# Patient Record
Sex: Female | Born: 1955 | Race: Asian | Hispanic: No | Marital: Married | Smoking: Never smoker
Health system: Southern US, Community
[De-identification: ages and names within clinical notes are randomized; demographics above are authoritative.]

## PROBLEM LIST (undated history)

## (undated) DIAGNOSIS — E785 Hyperlipidemia, unspecified: Secondary | ICD-10-CM

## (undated) DIAGNOSIS — I10 Essential (primary) hypertension: Secondary | ICD-10-CM

## (undated) DIAGNOSIS — J309 Allergic rhinitis, unspecified: Secondary | ICD-10-CM

## (undated) DIAGNOSIS — K219 Gastro-esophageal reflux disease without esophagitis: Secondary | ICD-10-CM

## (undated) HISTORY — DX: Allergic rhinitis, unspecified: J30.9

## (undated) HISTORY — DX: Hyperlipidemia, unspecified: E78.5

## (undated) HISTORY — PX: BREAST LUMPECTOMY: SHX2

## (undated) HISTORY — DX: Essential (primary) hypertension: I10

## (undated) HISTORY — PX: TONSILLECTOMY: SUR1361

## (undated) HISTORY — DX: Gastro-esophageal reflux disease without esophagitis: K21.9

---

## 2014-01-12 ENCOUNTER — Encounter: Payer: Self-pay | Admitting: Internal Medicine

## 2014-01-12 ENCOUNTER — Telehealth: Payer: Self-pay | Admitting: *Deleted

## 2014-01-12 ENCOUNTER — Ambulatory Visit: Payer: Self-pay | Attending: Internal Medicine | Admitting: Internal Medicine

## 2014-01-12 VITALS — BP 116/74 | HR 68 | Temp 97.7°F | Resp 16 | Ht 63.0 in | Wt 102.0 lb

## 2014-01-12 DIAGNOSIS — R05 Cough: Secondary | ICD-10-CM | POA: Insufficient documentation

## 2014-01-12 DIAGNOSIS — R042 Hemoptysis: Secondary | ICD-10-CM

## 2014-01-12 DIAGNOSIS — Z7689 Persons encountering health services in other specified circumstances: Secondary | ICD-10-CM

## 2014-01-12 DIAGNOSIS — R059 Cough, unspecified: Secondary | ICD-10-CM | POA: Insufficient documentation

## 2014-01-12 DIAGNOSIS — Z7189 Other specified counseling: Secondary | ICD-10-CM

## 2014-01-12 LAB — CBC WITH DIFFERENTIAL/PLATELET
Basophils Absolute: 0 10*3/uL (ref 0.0–0.1)
Basophils Relative: 0 % (ref 0–1)
EOS PCT: 3 % (ref 0–5)
Eosinophils Absolute: 0.2 10*3/uL (ref 0.0–0.7)
HEMATOCRIT: 39.3 % (ref 36.0–46.0)
HEMOGLOBIN: 13.5 g/dL (ref 12.0–15.0)
LYMPHS PCT: 41 % (ref 12–46)
Lymphs Abs: 2.2 10*3/uL (ref 0.7–4.0)
MCH: 30.8 pg (ref 26.0–34.0)
MCHC: 34.4 g/dL (ref 30.0–36.0)
MCV: 89.5 fL (ref 78.0–100.0)
MONO ABS: 0.3 10*3/uL (ref 0.1–1.0)
MONOS PCT: 6 % (ref 3–12)
Neutro Abs: 2.7 10*3/uL (ref 1.7–7.7)
Neutrophils Relative %: 50 % (ref 43–77)
Platelets: 328 10*3/uL (ref 150–400)
RBC: 4.39 MIL/uL (ref 3.87–5.11)
RDW: 13.3 % (ref 11.5–15.5)
WBC: 5.4 10*3/uL (ref 4.0–10.5)

## 2014-01-12 LAB — COMPLETE METABOLIC PANEL WITH GFR
ALBUMIN: 4.3 g/dL (ref 3.5–5.2)
ALT: 23 U/L (ref 0–35)
AST: 22 U/L (ref 0–37)
Alkaline Phosphatase: 62 U/L (ref 39–117)
BUN: 14 mg/dL (ref 6–23)
CHLORIDE: 101 meq/L (ref 96–112)
CO2: 31 mEq/L (ref 19–32)
Calcium: 9.8 mg/dL (ref 8.4–10.5)
Creat: 0.55 mg/dL (ref 0.50–1.10)
GFR, Est Non African American: >89 mL/min
GLUCOSE: 79 mg/dL (ref 70–99)
Potassium: 3.7 mEq/L (ref 3.5–5.3)
Sodium: 138 mEq/L (ref 135–145)
Total Bilirubin: 1 mg/dL (ref 0.2–1.2)
Total Protein: 7.7 g/dL (ref 6.0–8.3)

## 2014-01-12 LAB — POCT URINALYSIS DIPSTICK
BILIRUBIN UA: NEGATIVE
Glucose, UA: NEGATIVE
KETONES UA: NEGATIVE
LEUKOCYTES UA: NEGATIVE
Nitrite, UA: NEGATIVE
PH UA: 6.5
Protein, UA: NEGATIVE
Urobilinogen, UA: 0.2

## 2014-01-12 LAB — LIPID PANEL
CHOL/HDL RATIO: 3.7 ratio
Cholesterol: 278 mg/dL — ABNORMAL HIGH (ref 0–200)
HDL: 76 mg/dL (ref >39)
LDL Cholesterol: 184 mg/dL — ABNORMAL HIGH (ref 0–99)
Triglycerides: 89 mg/dL (ref <150)
VLDL: 18 mg/dL (ref 0–40)

## 2014-01-12 LAB — POCT GLYCOSYLATED HEMOGLOBIN (HGB A1C): Hemoglobin A1C: 5.5

## 2014-01-12 NOTE — Telephone Encounter (Signed)
Called patient to schedule appointment for TB skin testing per Dr. Hyman HopesJegede since patient c/o coughing up blood. Patient scheduled for 01/13/2014 at 11:00 am.

## 2014-01-12 NOTE — Progress Notes (Signed)
Patient here today to establish care.  Patient was on Azithromycin for respiratory infection.  Patient states she still has some chest and back pain from coughing with green mucus.

## 2014-01-12 NOTE — Telephone Encounter (Signed)
Called to inform patient about her CT appointment on Friday January 15, 2014. Informed patient [facility] Patient verbalized understanding. [name],[name], RN

## 2014-01-12 NOTE — Progress Notes (Signed)
Patient ID: Judith CrockerLoan Sherman, female   DOB: Dec 17, 1955, 58 y.o.   MRN: [id number]   Judith Sherman, is a 58 y.o. female  [id number]  [id number]  DOB - Dec 17, 1955  CC:  Chief Complaint  Patient presents with  . Establish Care       HPI: Judith Sherman is a 58 y.o. female here today to establish medical care. Patient has no significant past medical history but for sometimes not she has been coughing, she has gone through 3 courses of antibiotics but continues to cough and now she is coughing her blood. She came to [place] from [place] for 17 years ago and worked in [place] in [place] until recently when she relocated to [place]. She was exposed to secondhand smoke. She has no contact with chronic cough patient. She denies chest pain or tightness or shortness of breath. She does not have leg swelling no abdominal swelling. She agrees to mild weakness of about 6 pounds over this period of time. She remembers her father died of tuberculosis back in [place] but has little or no contact after diagnosis. She has no personal history of diabetes or hypertension, she does not smoke cigarette directly, she does not drink alcohol. Patient has No headache, No chest pain, No abdominal pain - No Nausea, No new weakness tingling or numbness.  Not on File No past medical history on file. No current outpatient prescriptions on file prior to visit.   No current facility-administered medications on file prior to visit.   No family history on file. History   Social History  . Marital Status: Divorced    Spouse Name: N/A    Number of Children: N/A  . Years of Education: N/A   Occupational History  . Not on file.   Social History Main Topics  . Smoking status: Never Smoker   . Smokeless tobacco: Not on file  . Alcohol Use: Not on file  . Drug Use: Not on file  . Sexual Activity: Not on file   Other Topics Concern  . Not on file   Social History Narrative  . No narrative on file     Review of Systems: Constitutional: Negative for fever, chills, diaphoresis, activity change, appetite change and fatigue. HENT: Negative for ear pain, nosebleeds, congestion, facial swelling, rhinorrhea, neck pain, neck stiffness and ear discharge.  Eyes: Negative for pain, discharge, redness, itching and visual disturbance. Respiratory: Positive for cough, no choking, chest tightness, shortness of breath, wheezing or stridor.  Cardiovascular: Negative for chest pain, palpitations and leg swelling. Gastrointestinal: Negative for abdominal distention. Genitourinary: Negative for dysuria, urgency, frequency, hematuria, flank pain, decreased urine volume, difficulty urinating and dyspareunia.  Musculoskeletal: Negative for back pain, joint swelling, arthralgia and gait problem. Neurological: Negative for dizziness, tremors, seizures, syncope, facial asymmetry, speech difficulty, weakness, light-headedness, numbness and headaches.  Hematological: Negative for adenopathy. Does not bruise/bleed easily. Psychiatric/Behavioral: Negative for hallucinations, behavioral problems, confusion, dysphoric mood, decreased concentration and agitation.    Objective:   Filed Vitals:   01/12/14 0952  BP: 116/74  Pulse: 68  Temp: 97.7 F (36.5 C)  Resp: 16    Physical Exam: Constitutional: Patient appears well-developed and well-nourished. No distress. Thin built HENT: Normocephalic, atraumatic, External right and left ear normal. Oropharynx is clear and moist.  Eyes: Conjunctivae and EOM are normal. PERRLA, no scleral icterus. Neck: Normal ROM. Neck supple. No JVD. No tracheal deviation. No thyromegaly. CVS: RRR, S1/S2 +, no murmurs, no gallops, no carotid bruit.  Pulmonary: Effort and  breath sounds normal, no stridor, rhonchi, wheezes, rales.  Abdominal: Soft. BS +, no distension, tenderness, rebound or guarding.  Musculoskeletal: Normal range of motion. No edema and no tenderness.   Lymphadenopathy: No lymphadenopathy noted, cervical, inguinal or axillary Neuro: Alert. Normal reflexes, muscle tone coordination. No cranial nerve deficit. Skin: Skin is warm and dry. No rash noted. Not diaphoretic. No erythema. No pallor. Psychiatric: Normal mood and affect. Behavior, judgment, thought content normal.  No results found for this basename: WBC, HGB, HCT, MCV, PLT   No results found for this basename: CREATININE, BUN, NA, K, CL, CO2    Lab Results  Component Value Date   HGBA1C 5.5 01/12/2014   Lipid Panel  No results found for this basename: chol, trig, hdl, cholhdl, vldl, ldlcalc       Assessment and plan:   1. Establishing care with new doctor, encounter for  - POCT urinalysis dipstick - CBC with Differential - COMPLETE METABOLIC PANEL WITH GFR - POCT glycosylated hemoglobin (Hb A1C) - Lipid panel - Urinalysis, Complete - Vit D  25 hydroxy (rtn osteoporosis monitoring)  2. Hemoptysis  - CT Chest W Contrast; Future - PPD  Patient was counseled extensively on nutrition and exercise  Return in about 3 months (around 04/14/2014), or if symptoms worsen or fail to improve, for COPD.  The patient was given clear instructions to go to ER or return to medical center if symptoms don't improve, worsen or new problems develop. The patient verbalized understanding. The patient was told to call to get lab results if they haven't heard anything in the next week.     This note has been created with Education officer, environmentalDragon speech recognition software and smart phrase technology. Any transcriptional errors are unintentional.    Jeanann LewandowskyJEGEDE, OLUGBEMIGA, MD, MHA, FACP, FAAP [facility] [place], [place] [phone]   01/12/2014, 11:16 AM

## 2014-01-12 NOTE — Patient Instructions (Signed)
Hemoptysis  Hemoptysis, which means coughing up blood, can be a sign of a minor problem or a serious medical condition. The blood that is coughed up may come from the lungs and airways. Coughed-up blood can also come from bleeding that occurs outside the lungs and airways. Blood can drain into the windpipe during a severe nosebleed or when blood is vomited from the stomach. Because hemoptysis can be a sign of something serious, a medical evaluation is required. For some people with hemoptysis, no definite cause is ever identified.  CAUSES   The most common cause of hemoptysis is bronchitis. Some other common causes include:    A ruptured blood vessel caused by coughing or an infection.    A medical condition that causes damage to the large air passageways (bronchiectasis).    A blood clot in the lungs (pulmonary embolism).    Pneumonia.    Tuberculosis.    Breathing in a small foreign object.    Cancer.  For some people with hemoptysis, no definite cause is ever identified.   HOME CARE INSTRUCTIONS   Only take over-the-counter or prescription medicines as directed by your caregiver. Do not use cough suppressants unless your caregiver approves.   If your caregiver prescribes antibiotic medicines, take them as directed. Finish them even if you start to feel better.   Do not smoke. Also avoid secondhand smoke.   Follow up with your caregiver as directed.  SEEK IMMEDIATE MEDICAL CARE IF:    You cough up bloody mucus for longer than a week.   You have a blood-producing cough that is severe or getting worse.   You have a blood-producing cough thatcomes and goes over time.   You develop problems with your breathing.    You vomit blood.   You develop bloody or black-colored stools.   You have chest pain.    You develop night sweats.   You feel faint or pass out.    You have a fever or persistent symptoms for more than 2-3 days.   You have a fever and your symptoms suddenly get worse.  MAKE  SURE YOU:   Understand these instructions.   Will watch your condition.   Will get help right away if you are not doing well or get worse.  Document Released: 09/17/2001 Document Revised: 06/25/2012 Document Reviewed: 04/25/2012  ExitCare Patient Information 2015 [employer]. This information is not intended to replace advice given to you by your health care provider. Make sure you discuss any questions you have with your health care provider.

## 2014-01-13 ENCOUNTER — Ambulatory Visit: Payer: No Typology Code available for payment source | Attending: Internal Medicine

## 2014-01-13 ENCOUNTER — Telehealth: Payer: Self-pay | Admitting: Emergency Medicine

## 2014-01-13 DIAGNOSIS — Z0000 Encounter for general adult medical examination without abnormal findings: Secondary | ICD-10-CM

## 2014-01-13 LAB — URINALYSIS, COMPLETE
Bacteria, UA: NONE SEEN
Bilirubin Urine: NEGATIVE
Casts: NONE SEEN
Crystals: NONE SEEN
GLUCOSE, UA: NEGATIVE mg/dL
Hgb urine dipstick: NEGATIVE
Ketones, ur: NEGATIVE mg/dL
LEUKOCYTES UA: NEGATIVE
Nitrite: NEGATIVE
PROTEIN: NEGATIVE mg/dL
SPECIFIC GRAVITY, URINE: 1.007 (ref 1.005–1.030)
SQUAMOUS EPITHELIAL / LPF: NONE SEEN
Urobilinogen, UA: 0.2 mg/dL (ref 0.0–1.0)
pH: 6.5 (ref 5.0–8.0)

## 2014-01-13 LAB — VITAMIN D 25 HYDROXY (VIT D DEFICIENCY, FRACTURES): Vit D, 25-Hydroxy: 33 ng/mL (ref 30–89)

## 2014-01-13 MED ORDER — SIMVASTATIN 40 MG PO TABS
40.0000 mg | ORAL_TABLET | Freq: Every day | ORAL | Status: DC
Start: 2014-01-13 — End: 2014-05-06

## 2014-01-13 NOTE — Progress Notes (Unsigned)
Pt is here for ppd test. Successful wheal. Pt understands her instructions to return on Friday so we can review the results.

## 2014-01-13 NOTE — Patient Instructions (Signed)
You have to come back on Friday so we can review your results for the ppd test.

## 2014-01-13 NOTE — Telephone Encounter (Signed)
Left message for pt to call for lab results with new med instructions to start taking Simvastatin 40 mg daily Med [facility]

## 2014-01-13 NOTE — Telephone Encounter (Signed)
Message copied by Darlis LoanSMITH, JILL D on Wed Jan 13, 2014 11:50 AM ------      Message from: Jeanann LewandowskyJEGEDE, OLUGBEMIGA E      Created: Wed Jan 13, 2014 10:14 AM       Please inform patient that her laboratory tests results are mostly within normal limits except for her cholesterol that is very high. We need to start her on medication, we also encouraged her to start regular physical exercise at least 3 times a week, and to adhere strictly with dietary control including low-fat low-cholesterol diet            Please call in prescription simvastatin 40 mg tablet by mouth daily, 90 tablets with 3 refills ------

## 2014-01-15 ENCOUNTER — Ambulatory Visit: Payer: Self-pay | Attending: Internal Medicine

## 2014-01-15 ENCOUNTER — Ambulatory Visit (HOSPITAL_COMMUNITY)
Admission: RE | Admit: 2014-01-15 | Discharge: 2014-01-15 | Disposition: A | Discharge status: Home / Self Care | Payer: No Typology Code available for payment source | Source: Ambulatory Visit | Attending: Internal Medicine | Admitting: Internal Medicine

## 2014-01-15 DIAGNOSIS — R61 Generalized hyperhidrosis: Secondary | ICD-10-CM | POA: Insufficient documentation

## 2014-01-15 DIAGNOSIS — R079 Chest pain, unspecified: Secondary | ICD-10-CM | POA: Insufficient documentation

## 2014-01-15 DIAGNOSIS — R042 Hemoptysis: Secondary | ICD-10-CM | POA: Insufficient documentation

## 2014-01-15 LAB — TB SKIN TEST
Induration: 0 mm
TB Skin Test: NEGATIVE

## 2014-01-15 MED ORDER — IOHEXOL 300 MG/ML  SOLN
80.0000 mL | Freq: Once | INTRAMUSCULAR | Status: AC | PRN
  Administered 2014-01-15: 80 mL via INTRAVENOUS

## 2014-01-15 NOTE — Progress Notes (Unsigned)
Pt came in today to review her PPD results. Her PPD test was negative. I gave her a document to show that her test was negative.

## 2014-01-18 ENCOUNTER — Other Ambulatory Visit: Payer: Self-pay

## 2014-01-18 ENCOUNTER — Ambulatory Visit (HOSPITAL_COMMUNITY)
Admission: RE | Admit: 2014-01-18 | Discharge: 2014-01-18 | Disposition: A | Discharge status: Home / Self Care | Payer: No Typology Code available for payment source | Source: Ambulatory Visit | Attending: Cardiology | Admitting: Cardiology

## 2014-01-18 ENCOUNTER — Ambulatory Visit: Payer: Self-pay | Attending: Internal Medicine | Admitting: Internal Medicine

## 2014-01-18 VITALS — Ht 63.0 in | Wt 103.0 lb

## 2014-01-18 DIAGNOSIS — R079 Chest pain, unspecified: Secondary | ICD-10-CM | POA: Insufficient documentation

## 2014-01-18 DIAGNOSIS — Z0000 Encounter for general adult medical examination without abnormal findings: Secondary | ICD-10-CM | POA: Insufficient documentation

## 2014-01-18 DIAGNOSIS — R05 Cough: Secondary | ICD-10-CM | POA: Insufficient documentation

## 2014-01-18 DIAGNOSIS — R634 Abnormal weight loss: Secondary | ICD-10-CM | POA: Insufficient documentation

## 2014-01-18 DIAGNOSIS — R059 Cough, unspecified: Secondary | ICD-10-CM | POA: Insufficient documentation

## 2014-01-18 NOTE — Progress Notes (Signed)
Patient ID: Judith Sherman, female   DOB: April 17, 1956, 58 y.o.   MRN: [id number]   Judith Sherman, is a 58 y.o. female  [id number]  [id number]  DOB - April 17, 1956  Chief Complaint  Patient presents with  . Follow-up        Subjective:   Judith Sherman is a 58 y.o. female here today for a follow up visit. Patient was seen here recently for chronic cough-and weight loss, CT chest was ordered which came back normal, also had screening for tuberculosis came back negative. Patient is here today to review the tests results and also to request for her annual physical examination. She has some chest pain mostly on the right side not radiating, she does not have any reflux disease. Her laboratory tests recently showed dyslipidemia patient has been started on medication. She does not smoke cigarettes she does not drink alcohol Patient has No headache, No chest pain, No abdominal pain - No Nausea, No new weakness tingling or numbness, No Cough - SOB.  Problem  Chest Pain, Unspecified  Preventative Health Care    ALLERGIES: Not on File  PAST MEDICAL HISTORY: No past medical history on file.  MEDICATIONS AT HOME: Prior to Admission medications   Medication Sig Start Date End Date Taking? Authorizing Provider  simvastatin (ZOCOR) 40 MG tablet Take 1 tablet (40 mg total) by mouth at bedtime. 01/13/14   Jeanann Lewandowskylugbemiga Jegede, MD     Objective:   Filed Vitals:   01/18/14 1151  Height: 5\' 3"  (1.6 m)  Weight: 103 lb (46.72 kg)    Exam General appearance : Awake, alert, not in any distress. Speech Clear. Not toxic looking HEENT: Atraumatic and Normocephalic, pupils equally reactive to light and accomodation Neck: supple, no JVD. No cervical lymphadenopathy.  Chest:Good air entry bilaterally, no added sounds  CVS: S1 S2 regular, no murmurs.  Abdomen: Bowel sounds present, Non tender and not distended with no gaurding, rigidity or rebound. Extremities: B/L Lower Ext shows no edema, both legs are  warm to touch Neurology: Awake alert, and oriented X 3, CN II-XII intact, Non focal Skin:No Rash Wounds:N/A  Data Review Lab Results  Component Value Date   HGBA1C 5.5 01/12/2014     Assessment & Plan   1. Chest pain, unspecified  - EKG 12-Lead shows normal sinus rhythm with no specific ST-T changes  2. Preventative health care  - HM COLONOSCOPY - Ambulatory referral to Gastroenterology - MM DIGITAL SCREENING BILATERAL; Future  Patient was counseled extensively about nutrition and exercise   Return in about 6 months (around 07/20/2014), or if symptoms worsen or fail to improve, for Dyslipidemia, , Routine Follow Up.  The patient was given clear instructions to go to ER or return to medical center if symptoms don't improve, worsen or new problems develop. The patient verbalized understanding. The patient was told to call to get lab results if they haven't heard anything in the next week.   This note has been created with Education officer, environmentalDragon speech recognition software and smart phrase technology. Any transcriptional errors are unintentional.    Jeanann LewandowskyJEGEDE, OLUGBEMIGA, MD, MHA, FACP, FAAP [facility], [place] [phone]   01/18/2014, 12:38 PM

## 2014-01-18 NOTE — Patient Instructions (Signed)

## 2014-01-18 NOTE — Progress Notes (Signed)
Pt is here having chest pain and pain in her back. Pt is requesting to review her lab results and ct scan.

## 2014-01-21 ENCOUNTER — Telehealth: Payer: Self-pay | Admitting: *Deleted

## 2014-01-21 NOTE — Telephone Encounter (Signed)
Patient called stating she has not received a call about her mammogram appointment. [facility] for Wednesday 01/27/2014 at 0815. Patient notified of appointment and [facility] Patient verbalized understanding.

## 2014-01-25 ENCOUNTER — Telehealth: Payer: Self-pay

## 2014-01-25 NOTE — Telephone Encounter (Signed)
Message copied by Lestine MountJUAREZ, Kenslie Abbruzzese L on Mon Jan 25, 2014 12:28 PM ------      Message from: Jeanann LewandowskyJEGEDE, OLUGBEMIGA E      Created: Wed Jan 20, 2014 11:02 PM       Please inform patient that her CT chest with contrast is normal ------

## 2014-01-25 NOTE — Telephone Encounter (Signed)
Interpreter line used Patient not available Left message to return our call

## 2014-01-27 ENCOUNTER — Ambulatory Visit (HOSPITAL_COMMUNITY)
Admission: RE | Admit: 2014-01-27 | Discharge: 2014-01-27 | Disposition: A | Discharge status: Home / Self Care | Payer: Self-pay | Source: Ambulatory Visit | Attending: Internal Medicine | Admitting: Internal Medicine

## 2014-01-27 ENCOUNTER — Other Ambulatory Visit: Payer: Self-pay | Admitting: Internal Medicine

## 2014-01-27 DIAGNOSIS — N63 Unspecified lump in unspecified breast: Secondary | ICD-10-CM

## 2014-01-27 DIAGNOSIS — Z0000 Encounter for general adult medical examination without abnormal findings: Secondary | ICD-10-CM

## 2014-01-29 ENCOUNTER — Ambulatory Visit: Payer: No Typology Code available for payment source | Attending: Internal Medicine

## 2014-02-01 ENCOUNTER — Other Ambulatory Visit: Payer: Self-pay

## 2014-02-01 ENCOUNTER — Other Ambulatory Visit: Payer: Self-pay | Admitting: Internal Medicine

## 2014-02-01 DIAGNOSIS — N63 Unspecified lump in unspecified breast: Secondary | ICD-10-CM

## 2014-02-04 ENCOUNTER — Other Ambulatory Visit: Payer: Self-pay

## 2014-02-04 ENCOUNTER — Inpatient Hospital Stay: Admission: RE | Admit: 2014-02-04 | Payer: Self-pay | Source: Ambulatory Visit

## 2014-02-17 ENCOUNTER — Encounter (INDEPENDENT_AMBULATORY_CARE_PROVIDER_SITE_OTHER): Payer: Self-pay

## 2014-02-17 ENCOUNTER — Ambulatory Visit
Admission: RE | Admit: 2014-02-17 | Discharge: 2014-02-17 | Disposition: A | Discharge status: Home / Self Care | Payer: No Typology Code available for payment source | Source: Ambulatory Visit | Attending: Internal Medicine | Admitting: Internal Medicine

## 2014-02-17 DIAGNOSIS — N63 Unspecified lump in unspecified breast: Secondary | ICD-10-CM

## 2014-02-18 ENCOUNTER — Telehealth: Payer: Self-pay | Admitting: *Deleted

## 2014-02-18 NOTE — Telephone Encounter (Signed)
Left message to return my call.  

## 2014-02-18 NOTE — Telephone Encounter (Signed)
Message copied by Raynelle CharyWINFREE, Cire Deyarmin R on Thu Feb 18, 2014  9:43 AM ------      Message from: Jeanann LewandowskyJEGEDE, OLUGBEMIGA E      Created: Wed Feb 17, 2014  5:26 PM       Please inform patient that her mammogram shows no evidence of malignancy ------

## 2014-04-05 ENCOUNTER — Telehealth: Payer: Self-pay | Admitting: Internal Medicine

## 2014-04-05 NOTE — Telephone Encounter (Signed)
Pt. Stated that pcp was referring her to get a Colonscopy done. Pt. Does not have any information regarding this referral, please f/u with pt.

## 2014-04-13 ENCOUNTER — Ambulatory Visit: Payer: Self-pay | Admitting: Internal Medicine

## 2014-05-06 ENCOUNTER — Encounter: Payer: Self-pay | Admitting: Internal Medicine

## 2014-05-06 ENCOUNTER — Ambulatory Visit: Payer: No Typology Code available for payment source | Attending: Internal Medicine | Admitting: Internal Medicine

## 2014-05-06 VITALS — BP 144/85 | HR 71 | Temp 97.6°F | Resp 16 | Ht 63.0 in | Wt 100.0 lb

## 2014-05-06 DIAGNOSIS — Z0000 Encounter for general adult medical examination without abnormal findings: Secondary | ICD-10-CM

## 2014-05-06 DIAGNOSIS — Z681 Body mass index (BMI) 19 or less, adult: Secondary | ICD-10-CM | POA: Insufficient documentation

## 2014-05-06 DIAGNOSIS — R1031 Right lower quadrant pain: Secondary | ICD-10-CM | POA: Insufficient documentation

## 2014-05-06 DIAGNOSIS — E785 Hyperlipidemia, unspecified: Secondary | ICD-10-CM | POA: Insufficient documentation

## 2014-05-06 DIAGNOSIS — K219 Gastro-esophageal reflux disease without esophagitis: Secondary | ICD-10-CM | POA: Insufficient documentation

## 2014-05-06 DIAGNOSIS — R634 Abnormal weight loss: Secondary | ICD-10-CM | POA: Insufficient documentation

## 2014-05-06 LAB — POCT URINALYSIS DIPSTICK
BILIRUBIN UA: NEGATIVE
Glucose, UA: NEGATIVE
Ketones, UA: NEGATIVE
Leukocytes, UA: NEGATIVE
Nitrite, UA: NEGATIVE
Protein, UA: NEGATIVE
Urobilinogen, UA: 0.2
pH, UA: 6

## 2014-05-06 MED ORDER — PANTOPRAZOLE SODIUM 40 MG PO TBEC: 40.0000 mg | DELAYED_RELEASE_TABLET | Freq: Every day | ORAL | Status: DC

## 2014-05-06 MED ORDER — SIMVASTATIN 40 MG PO TABS: 40.0000 mg | ORAL_TABLET | Freq: Every day | ORAL | Status: DC

## 2014-05-06 NOTE — Patient Instructions (Signed)
?  au b?ng °(Abdominal Pain) °Có nhi?u nguyên nhân d?n ??n ?au b?ng. Thông th??ng ?au b?ng là do m?t b?nh gây ra và s? không ?? n?u không ?i?u tr?. B?nh này có th? ???c theo dõi và ?i?u tr? t?i nhà. Chuyên gia ch?m sóc s?c kh?e s? ti?n hành khám th?c th? và có th? yêu c?u làm xét nghi?m máu và ch?p X quang ?? xác ??nh m?c ?? nghiêm tr?ng c?a c?n ?au b?ng. Tuy nhiên, trong nhi?u tr??ng h?p, ph?i m?t nhi?u th?i gian h?n ?? xác ??nh rõ nguyên nhân gây ?au b?ng. Tr??c khi tìm ra nguyên nhân, chuyên gia ch?m sóc s?c kh?e có th? không bi?t li?u quý v? có c?n làm thêm xét nghi?m ho?c ti?p t?c ?i?u tr? hay không. °H??NG D?N CH?M SÓC T?I NHÀ  °Theo dõi c?n ?au b?ng xem có b?t k? thay ??i nào không. Nh?ng hành ??ng sau có th? giúp lo?i b? b?t c? c?m giác khó ch?u nào quý v? ?ang b?. °· Ch? s? d?ng thu?c không c?n kê ??n ho?c thu?c c?n kê ??n theo ch? d?n c?a chuyên gia ch?m sóc s?c kh?e. °· Không dùng thu?c nhu?n tràng tr? khi ???c chuyên gia ch?m sóc s?c kh?e ch? ??nh. °· Th? dùng m?t ch? ?? ?n l?ng (n??c lu?c th?t, trà, ho?c n??c) theo ch? d?n c?a chuyên gia ch?m sóc s?c kh?e. Chuy?n d?n sang m?t ch? ?? ?n nh? n?u ch?u ???c. °?I KHÁM N?U: °· Quý v? b? ?au b?ng không rõ nguyên nhân. °· Quý v? b? ?au b?ng kèm theo bu?n nôn ho?c tiêu ch?y. °· Quý v? b? ?au khi ?i ti?u ho?c ?i ngoài. °· Quý v? b? c?n ?au b?ng làm th?c gi?c vào ban ?êm. °· Quý v? b? ?au b?ng n?ng thêm ho?c ?? h?n khi ?n. °· Quý v? b? ?au b?ng n?ng thêm khi ?n ?? ?n nhi?u ch?t béo. °· Quý v? b? s?t. °NGAY L?P T?C ?I KHÁM N?U:  °· C?n ?au không kh?i trong vòng 2 gi?. °· Quý v? v?n ti?p t?c b? ói (nôn m?a). °· Ch? có th? c?m th?y ?au ? m?t s? ph?n b?ng, ch?ng h?n nh? ? ph?n b?ng bên ph?i ho?c bên d??i trái. °· Phân c?a quý v? có máu ho?c có màu ?en nh? h?c ín. °??M B?O QUÝ V?: °· Hi?u rõ các h??ng d?n này. °· S? theo dõi tình tr?ng c?a mình. °· S? yêu c?u tr? giúp ngay l?p t?c n?u quý v? c?m th?y không kh?e ho?c th?y tr?m tr?ng h?n. °Document Released: 07/09/2005  Document Revised: 07/14/2013 °ExitCare® Patient Information ©2015 [employer], [employer]. This information is not intended to replace advice given to you by your health care provider. Make sure you discuss any questions you have with your health care provider. ° °

## 2014-05-06 NOTE — Progress Notes (Signed)
Patient ID: Judith Sherman, female   DOB: May 26, 1956, 58 y.o.   MRN: [id number]   Judith Sherman, is a 58 y.o. female  [id number]  [id number]  DOB - May 26, 1956  Chief Complaint  Patient presents with  . Follow-up        Subjective:   Judith Sherman is a 58 y.o. female here today for a follow up visit. Patient with history of hyperlipidemia and GERD here today with c/o abdomen pain on her right side. She states that she has had that pain for a year and now the pain is back and is very painful , non-radiating, no known aggravating or relieving factor. She also said that she is having GERD and its acting up. Patient has No headache, No chest pain, No Nausea, No new weakness tingling or numbness, No Cough - SOB.   Problem  Loss of Weight  Right Lower Quadrant Abdominal Pain  Gastroesophageal Reflux Disease Without Esophagitis  Dyslipidemia    ALLERGIES: No Known Allergies  PAST MEDICAL HISTORY: History reviewed. No pertinent past medical history.  MEDICATIONS AT HOME: Prior to Admission medications   Medication Sig Start Date End Date Taking? Authorizing Provider  simvastatin (ZOCOR) 40 MG tablet Take 1 tablet (40 mg total) by mouth at bedtime. 05/06/14  Yes Quentin Angstlugbemiga E Amori Colomb, MD  pantoprazole (PROTONIX) 40 MG tablet Take 1 tablet (40 mg total) by mouth daily. 05/06/14   Quentin Angstlugbemiga E Pranika Finks, MD     Objective:   Filed Vitals:   05/06/14 1210  BP: 144/85  Pulse: 71  Temp: 97.6 F (36.4 C)  TempSrc: Oral  Resp: 16  Height: 5\' 3"  (1.6 m)  Weight: 100 lb (45.36 kg)  SpO2: 99%    Exam General appearance : Awake, alert, not in any distress. Speech Clear. Not toxic looking HEENT: Atraumatic and Normocephalic, pupils equally reactive to light and accomodation Neck: supple, no JVD. No cervical lymphadenopathy.  Chest:Good air entry bilaterally, no added sounds  CVS: S1 S2 regular, no murmurs.  Abdomen: Bowel sounds present, ++ tender but not distended with no gaurding,  rigidity or rebound. Extremities: B/L Lower Ext shows no edema, both legs are warm to touch Neurology: Awake alert, and oriented X 3, CN II-XII intact, Non focal  Data Review Lab Results  Component Value Date   HGBA1C 5.5 01/12/2014     Assessment & Plan   1. Loss of weight  - CT Abdomen Pelvis W Contrast; Future  2. Right lower quadrant abdominal pain  - CT Abdomen Pelvis W Contrast; Future - Ambulatory referral to Gastroenterology - HM COLONOSCOPY  3. Gastroesophageal reflux disease without esophagitis  - pantoprazole (PROTONIX) 40 MG tablet; Take 1 tablet (40 mg total) by mouth daily.  Dispense: 30 tablet; Refill: 3  4. Dyslipidemia  - simvastatin (ZOCOR) 40 MG tablet; Take 1 tablet (40 mg total) by mouth at bedtime.  Dispense: 90 tablet; Refill: 3  To address this please limit saturated fat to no more than 7% of your calories, limit cholesterol to 200 mg/day, increase fiber and exercise as tolerated. If needed we may add another cholesterol lowering medication to your regimen.   Return in about 6 months (around 11/05/2014), or if symptoms worsen or fail to improve, for Follow up Pain and comorbidities, Abdominal Pain.  The patient was given clear instructions to go to ER or return to medical center if symptoms don't improve, worsen or new problems develop. The patient verbalized understanding. The patient was told to call to get lab  results if they haven't heard anything in the next week.   This note has been created with Education officer, environmentalDragon speech recognition software and smart phrase technology. Any transcriptional errors are unintentional.    Jeanann LewandowskyJEGEDE, Denishia Citro, MD, MHA, FACP, FAAP [facility] [facility] [place], [place] [phone]   05/06/2014, 12:39 PM

## 2014-05-06 NOTE — Progress Notes (Signed)
Pt is here c/o abdomen pain on her right side. She states that she has had that pain for a year and now the pain is back and is very painful. She also said that she is having GERD. Pt has an interpreter.

## 2014-05-07 ENCOUNTER — Encounter: Payer: Self-pay | Admitting: Gastroenterology

## 2014-05-10 ENCOUNTER — Ambulatory Visit (HOSPITAL_COMMUNITY)
Admission: RE | Admit: 2014-05-10 | Discharge: 2014-05-10 | Disposition: A | Discharge status: Home / Self Care | Payer: Self-pay | Source: Ambulatory Visit | Attending: Internal Medicine | Admitting: Internal Medicine

## 2014-05-10 ENCOUNTER — Telehealth: Payer: Self-pay | Admitting: Emergency Medicine

## 2014-05-10 DIAGNOSIS — G8929 Other chronic pain: Secondary | ICD-10-CM | POA: Insufficient documentation

## 2014-05-10 DIAGNOSIS — R634 Abnormal weight loss: Secondary | ICD-10-CM | POA: Insufficient documentation

## 2014-05-10 DIAGNOSIS — R1031 Right lower quadrant pain: Secondary | ICD-10-CM | POA: Insufficient documentation

## 2014-05-10 MED ORDER — IOHEXOL 300 MG/ML  SOLN
100.0000 mL | Freq: Once | INTRAMUSCULAR | Status: AC | PRN
  Administered 2014-05-10: 100 mL via INTRAVENOUS

## 2014-05-10 NOTE — Telephone Encounter (Signed)
Message copied by Darlis LoanSMITH, Lilee Aldea D on Mon May 10, 2014  3:30 PM ------      Message from: Quentin AngstJEGEDE, OLUGBEMIGA E      Created: Mon May 10, 2014 10:33 AM       Please inform patient that her CT abdomen and pelvis is normal ------

## 2014-05-31 ENCOUNTER — Encounter: Payer: Self-pay | Admitting: *Deleted

## 2014-05-31 NOTE — Progress Notes (Signed)
Pt came into the office today requesting to review her CT scan. I made the pt aware of her CT results.

## 2014-06-09 ENCOUNTER — Ambulatory Visit: Payer: No Typology Code available for payment source | Admitting: Gastroenterology

## 2014-06-09 ENCOUNTER — Other Ambulatory Visit: Payer: Self-pay

## 2014-06-09 ENCOUNTER — Encounter: Payer: Self-pay | Admitting: Gastroenterology

## 2014-06-09 ENCOUNTER — Ambulatory Visit (INDEPENDENT_AMBULATORY_CARE_PROVIDER_SITE_OTHER): Payer: No Typology Code available for payment source | Admitting: Gastroenterology

## 2014-06-09 VITALS — BP 115/64 | HR 72 | Temp 96.4°F | Ht 61.0 in | Wt 101.0 lb

## 2014-06-09 DIAGNOSIS — R1031 Right lower quadrant pain: Secondary | ICD-10-CM

## 2014-06-09 DIAGNOSIS — Z1211 Encounter for screening for malignant neoplasm of colon: Secondary | ICD-10-CM

## 2014-06-09 MED ORDER — PEG-KCL-NACL-NASULF-NA ASC-C 100 G PO SOLR: 1.0000 | Freq: Once | ORAL | Status: AC

## 2014-06-09 NOTE — Patient Instructions (Signed)
1. Colonoscopy as scheduled. See separate instructions.  

## 2014-06-09 NOTE — Progress Notes (Signed)
REVIEWED. AGREE. NL CT OCT 2015.

## 2014-06-09 NOTE — Assessment & Plan Note (Signed)
58 year old [place] female, presenting with interpreter today, with 18 month history of intermittent right lower quadrant pain. No significant aggravating or alleviating factors. Recent CT scan reassuring. Overdue for colonoscopy at this time. Patient is requesting to pursue colonoscopy which is very reasonable. She subjectively feels like protonix helped her right lower quadrant pain, I find it difficult to make a correlation here. She denies any type of typical upper GI symptoms. Plan on a colonoscopy in the near future.  I have discussed the risks, alternatives, benefits with regards to but not limited to the risk of reaction to medication, bleeding, infection, perforation and the patient is agreeable to proceed. Written consent to be obtained.

## 2014-06-09 NOTE — Progress Notes (Signed)
Primary Care Physician:  Angelica Chessman, MD  Primary Gastroenterologist:  Barney Drain, MD   Chief Complaint  Patient presents with  . Abdominal Pain    HPI:  Judith Sherman is a 58 y.o. [place] female here at the request of Dr. Angelica Chessman for further evaluation of RLQ pain. Presents with interpreter.  Complains of this pain off and on for more than 18 months. First time she had the pain lasted for about 4 months. Currently she's been dealing with this pain for over a month. Some days pain free. Describes as a dull ache. Worse with a full meal. Denies nausea or vomiting. Her appetite is good. Denies weight loss. Asked her multiple times to confirm as PCP note dictated history weight loss. She denies any constipation or diarrhea. No melena rectal bleeding. Denies any heartburn.  Started on Protonix about 1 month ago. Today for couple weeks but has been feeling better since stopped the medication. Reports having an EGD done about 2 years ago in [place] and was told was normal. Remote colonoscopy over 10 years ago without any polyps per patient.  CT scan on 05/10/2014 as outlined below. No explanation for her pain. No recent labs but labs done in June were unremarkable including CBC, LFTs, kidney function.    Current Outpatient Prescriptions  Medication Sig Dispense Refill  . Ranitidine HCl (ZANTAC PO) Take by mouth as needed.    . simvastatin (ZOCOR) 40 MG tablet Take 1 tablet (40 mg total) by mouth at bedtime. 90 tablet 3  . pantoprazole (PROTONIX) 40 MG tablet Take 1 tablet (40 mg total) by mouth daily. (Patient taking differently: Take 40 mg by mouth daily. Patient quit the medication 2 weeks ago.) 30 tablet 3  . peg 3350 powder (MOVIPREP) 100 G SOLR Take 1 kit (200 g total) by mouth once. 1 kit 0   No current facility-administered medications for this visit.    Allergies as of 06/09/2014  . (No Known Allergies)    Past Medical History  Diagnosis Date  .  Hyperlipidemia     Past Surgical History  Procedure Laterality Date  . Breast lumpectomy      benign    Family History  Problem Relation Age of Onset  . Colon cancer Neg Hx     History   Social History  . Marital Status: Divorced    Spouse Name: N/A    Number of Children: 0  . Years of Education: N/A   Occupational History  . unemployed    Social History Main Topics  . Smoking status: Never Smoker   . Smokeless tobacco: Not on file  . Alcohol Use: No  . Drug Use: No  . Sexual Activity: Not on file   Other Topics Concern  . Not on file   Social History Narrative      ROS:  General: Negative for anorexia, weight loss, fever, chills, fatigue, weakness. Eyes: Negative for vision changes.  ENT: Negative for hoarseness, difficulty swallowing , nasal congestion. CV: Negative for chest pain, angina, palpitations, dyspnea on exertion, peripheral edema.  Respiratory: Negative for dyspnea at rest, dyspnea on exertion, cough, sputum, wheezing.  GI: See history of present illness. GU:  Negative for dysuria, hematuria, urinary incontinence, urinary frequency, nocturnal urination.  MS: Negative for joint pain, low back pain.  Derm: Negative for rash or itching.  Neuro: Negative for weakness, abnormal sensation, seizure, frequent headaches, memory loss, confusion.  Psych: Negative for anxiety, depression, suicidal ideation, hallucinations.  Endo: Negative  for unusual weight change.  Heme: Negative for bruising or bleeding. Allergy: Negative for rash or hives.    Physical Examination:  BP 115/64 mmHg  Pulse 72  Temp(Src) 96.4 F (35.8 C) (Oral)  Ht _0  (1.549 m)  Wt 101 lb (45.813 kg)  BMI 19.09 kg/m2   General: Well-nourished, well-developed in no acute distress.  Head: Normocephalic, atraumatic.   Eyes: Conjunctiva pink, no icterus. Mouth: Oropharyngeal mucosa moist and pink , no lesions erythema or exudate. Neck: Supple without thyromegaly, masses, or  lymphadenopathy.  Lungs: Clear to auscultation bilaterally.  Heart: Regular rate and rhythm, no murmurs rubs or gallops.  Abdomen: Bowel sounds are normal, nontender, nondistended, no hepatosplenomegaly or masses, no abdominal bruits or    hernia , no rebound or guarding.   Rectal: Not performed Extremities: No lower extremity edema. No clubbing or deformities.  Neuro: Alert and oriented x 4 , grossly normal neurologically.  Skin: Warm and dry, no rash or jaundice.   Psych: Alert and cooperative, normal mood and affect.  Labs: Lab Results  Component Value Date   WBC 5.4 01/12/2014   HGB 13.5 01/12/2014   HCT 39.3 01/12/2014   MCV 89.5 01/12/2014   PLT 328 01/12/2014   Lab Results  Component Value Date   CREATININE 0.55 01/12/2014   BUN 14 01/12/2014   NA 138 01/12/2014   K 3.7 01/12/2014   CL 101 01/12/2014   CO2 31 01/12/2014   Lab Results  Component Value Date   ALT 23 01/12/2014   AST 22 01/12/2014   ALKPHOS 62 01/12/2014   BILITOT 1.0 01/12/2014     Imaging Studies: CLINICAL DATA: Weight loss, right lower quadrant pain, chronic abdominal pain and swelling  EXAM: CT ABDOMEN AND PELVIS WITH CONTRAST  TECHNIQUE: Multidetector CT imaging of the abdomen and pelvis was performed using the standard protocol following bolus administration of intravenous contrast.  CONTRAST: 164m OMNIPAQUE IOHEXOL 300 MG/ML SOLN  COMPARISON: None  FINDINGS: Sagittal images of the spine shows mild degenerative changes lower lumbar spine. Lung bases are unremarkable. Enhanced liver is unremarkable. The pancreas, spleen and adrenal glands are unremarkable. Kidneys are symmetrical in size and enhancement. There is a cyst mid to upper pole of the right kidney posterior aspect measures 1.2 cm.  Delayed renal images shows bilateral renal symmetrical excretion. Mild atherosclerotic calcifications of abdominal aorta and iliac arteries. No aortic aneurysm.  No  hydronephrosis or hydroureter. Bilateral visualized proximal ureter on delayed images is unremarkable.  No small bowel obstruction. No pericecal inflammation. Normal appendix. No thickened or dilated bowel loops are noted.  The uterus is unremarkable. No adnexal mass. No pelvic ascites or adenopathy. The urinary bladder is unremarkable.  No destructive bony lesions are noted within pelvis.  IMPRESSION: 1. No acute inflammatory process within abdomen or pelvis. 2. There is a cyst mid to upper pole of the right kidney measures 1.2 cm. No hydronephrosis or hydroureter. 3. No pericecal inflammation. Normal appendix. 4. No adnexal mass.   Electronically Signed  By: LLahoma CrockerM.D.  On: 05/10/2014 10:28

## 2014-06-10 NOTE — Progress Notes (Signed)
cc'ed to pcp °

## 2014-06-21 ENCOUNTER — Telehealth: Payer: Self-pay

## 2014-06-21 MED ORDER — PEG-KCL-NACL-NASULF-NA ASC-C 100 G PO SOLR: 1.0000 | ORAL | Status: DC

## 2014-06-21 NOTE — Telephone Encounter (Signed)
T/C from pharmacist, Milderd Meagerriana, [facility]. Said the pt said her Movie prep was gong to be $100.00 plus. Asked for a free coupon and I had one. I gave her the numbers on it and sent the prescription to them. ( [facility]  BIN: [id number] PCN: [id number] Group: [id number] ID: [id number]  [facility] Movie prep order there.

## 2014-07-12 ENCOUNTER — Ambulatory Visit (HOSPITAL_COMMUNITY)
Admission: RE | Admit: 2014-07-12 | Discharge: 2014-07-12 | Disposition: A | Discharge status: Home / Self Care | Payer: No Typology Code available for payment source | Source: Ambulatory Visit | Attending: Gastroenterology | Admitting: Gastroenterology

## 2014-07-12 ENCOUNTER — Encounter (HOSPITAL_COMMUNITY)
Admission: RE | Disposition: A | Discharge status: Home / Self Care | Payer: Self-pay | Source: Ambulatory Visit | Attending: Gastroenterology

## 2014-07-12 ENCOUNTER — Ambulatory Visit: Payer: Self-pay | Admitting: Internal Medicine

## 2014-07-12 ENCOUNTER — Encounter (HOSPITAL_COMMUNITY): Payer: Self-pay

## 2014-07-12 DIAGNOSIS — K648 Other hemorrhoids: Secondary | ICD-10-CM | POA: Insufficient documentation

## 2014-07-12 DIAGNOSIS — K633 Ulcer of intestine: Secondary | ICD-10-CM

## 2014-07-12 DIAGNOSIS — E785 Hyperlipidemia, unspecified: Secondary | ICD-10-CM | POA: Insufficient documentation

## 2014-07-12 DIAGNOSIS — R1031 Right lower quadrant pain: Secondary | ICD-10-CM

## 2014-07-12 DIAGNOSIS — Z1211 Encounter for screening for malignant neoplasm of colon: Secondary | ICD-10-CM

## 2014-07-12 HISTORY — PX: COLONOSCOPY: SHX5424

## 2014-07-12 SURGERY — COLONOSCOPY
Anesthesia: Moderate Sedation

## 2014-07-12 MED ORDER — MEPERIDINE HCL 100 MG/ML IJ SOLN
INTRAMUSCULAR | Status: AC
  Filled 2014-07-12: qty 2

## 2014-07-12 MED ORDER — SODIUM CHLORIDE 0.9 % IV SOLN
INTRAVENOUS | Status: DC
  Administered 2014-07-12: 09:00:00 via INTRAVENOUS

## 2014-07-12 MED ORDER — MIDAZOLAM HCL 5 MG/5ML IJ SOLN
INTRAMUSCULAR | Status: DC | PRN
  Administered 2014-07-12: 1 mg via INTRAVENOUS
  Administered 2014-07-12: 2 mg via INTRAVENOUS
  Administered 2014-07-12: 1 mg via INTRAVENOUS

## 2014-07-12 MED ORDER — MIDAZOLAM HCL 5 MG/5ML IJ SOLN
INTRAMUSCULAR | Status: AC
  Filled 2014-07-12: qty 10

## 2014-07-12 MED ORDER — MEPERIDINE HCL 100 MG/ML IJ SOLN
INTRAMUSCULAR | Status: DC | PRN
  Administered 2014-07-12 (×2): 25 mg via INTRAVENOUS

## 2014-07-12 MED ORDER — ONDANSETRON HCL 4 MG/2ML IJ SOLN
INTRAMUSCULAR | Status: AC
  Filled 2014-07-12: qty 2

## 2014-07-12 MED ORDER — ONDANSETRON HCL 4 MG/2ML IJ SOLN
INTRAMUSCULAR | Status: DC | PRN
  Administered 2014-07-12: 4 mg via INTRAVENOUS

## 2014-07-12 NOTE — Discharge Instructions (Signed)
YOU HAD ONE SMALL ULCER IN YOUR SMALL BOWEL. I BIOPSIED YOUR SMALL BOWEL. YOU HAVE SMALL  INTERNAL HEMORRHOIDS.  FOLLOW A HIGH FIBER DIET. My office will contact you with your results WITHIN 14 DAYS OR YOU CAN LOOK THEM UP ON MY CHART AFTER DEC 23. NEXT COLONOSCOPY IN 10 YEARS.    Soi ru?t k?t, Ch?m [place] sau khi lm th? thu?t (Colonoscopy, Care After) Tham kh?o t? thng tin ny trong vi tu?n t?i. Nh?ng h??ng d?n ny cung c?p cho qu v? thng tin v? cch ch?m [place] b?n thn sau khi lm th? thu?t. Chuyn gia ch?m [place] s?c kh?e c?ng c th? h??ng d?n c? th? h?n cho qu v?. Vi?c ?i?u tr? c?a qu v? ? ???c ln k? ho?ch theo th?c hnh y khoa hi?n t?i, nh?ng v?n ?? ?i khi v?n x?y ra. Hy g?i cho chuyn gia ch?m [place] s?c kh?e n?u qu v? c b?t k? v?n ?? ho?c th?c m?c no sau khi lm th? thu?t. K? V?NG ?I?U G SAU TH? THU?T  Sau khi lm th? thu?t th??ng c nh?ng v?n ?? sau:  M?t l??ng mu nh? trong phn.  ?nh h?i m?t cht v b? ?au th?t ho?c ch??ng b?ng nh?. H??NG D?N CH?M [place] T?I NH  Khng li xe, v?n hnh my mc, ho?c k cc gi?y t? quan tr?ng trong 24 gi?[place].  Qu v? c th? t?m vi hoa sen ho?c tr? l?i cc ho?t ??ng th? ch?t thng th??ng, nh?ng c? ??ng v?i m?t t?c ?? ch?m h?n trong 24 gi? ??u tin.  Th??ng xuyn ngh? ng?i trong 24 gi? ??u tin.  ?i d?o xung quanh v ??t m?t ti ?m ln b?ng ?? gip gi?m ?au co th?t ho?c ch??ng b?ng.  U?ng ?? n??c ?? gi? cho n??c ti?u trong ho?c vng nh?t.  Qu v? c th? tr? l?i v?i ch? ?? ?n u?ng bnh th??ng theo ch? d?n c?a chuyn gia ch?m [place] s?c kh?e. Trnh cc mn ?n n?ng ho?c chin rn kh tiu ha.  Trnh u?ng r??u trong 24 gi? ho?c theo h??ng d?n c?a chuyn gia ch?m [place] s?c kh?e.  Ch? s? d?ng thu?c khng c?n k ??n ho?c thu?c c?n k ??n theo ch? d?n c?a chuyn gia ch?m [place] s?c kh?e.  N?u ph?i l?y m?t m?u m (sinh thi?t) trong lc lm th? thu?t:  Khng dng aspirin ho?c thu?c lm long mu trong vng 7 ngy, ho?c theo ch? d?n c?a chuyn gia ch?m [place]  s?c kh?e.  Khng u?ng r??u trong 7 ngy, ho?c theo h??ng d?n c?a chuyn gia ch?m [place] s?c kh?e.  ?n th?c ?n m?m trong 24 gi? ??u. ?I KHM N?U: Qu v? b? r?m mu lin t?c trong phn 2 - 3 ngy sau khi lm th? thu?t. NGAY L?P T?C ?I KHM N?U:  Qu v? nhi?u l?n c r?m mu trong phn.  ?i ??i ti?n c c?c mu ?ng trong phn.  B?ng qu v? ph?ng ln (ch??ng c?ng).  Qu v? b? bu?n nn ho?c nn m?a.  Qu v? b? s?t.  Qu v? b? ?au b?ng t?ng ln, khng thuyn gi?m sau khi dng thu?c. Document Released: 04/29/2013 [facility] Patient Information 2015 [employer] [place]. This information is not intended to replace advice given to you by your health care provider. Make sure you discuss any questions you have with your health care provider.

## 2014-07-12 NOTE — H&P (Signed)
  Primary Care Physician:  Angelica Chessman, MD Primary Gastroenterologist:  Dr. Oneida Alar  Pre-Procedure History & Physical: HPI:  Judith Sherman is a 57 y.o. female here for [place].  Past Medical History  Diagnosis Date  . Hyperlipidemia     Past Surgical History  Procedure Laterality Date  . Breast lumpectomy      benign    Prior to Admission medications   Medication Sig Start Date End Date Taking? Authorizing Provider  Ranitidine HCl (ZANTAC PO) Take 1 tablet by mouth daily as needed (heartburn).    Yes Historical Provider, MD  simvastatin (ZOCOR) 40 MG tablet Take 1 tablet (40 mg total) by mouth at bedtime. 05/06/14  Yes Tresa Garter, MD  pantoprazole (PROTONIX) 40 MG tablet Take 1 tablet (40 mg total) by mouth daily. Patient not taking: Reported on 06/14/2014 05/06/14   Tresa Garter, MD  peg 3350 powder (MOVIPREP) 100 G SOLR Take 1 kit (200 g total) by mouth as directed. 06/21/14   Danie Binder, MD    Allergies as of 06/09/2014  . (No Known Allergies)    Family History  Problem Relation Age of Onset  . Colon cancer Neg Hx     History   Social History  . Marital Status: Divorced    Spouse Name: N/A    Number of Children: 0  . Years of Education: N/A   Occupational History  . unemployed    Social History Main Topics  . Smoking status: Never Smoker   . Smokeless tobacco: Not on file  . Alcohol Use: No  . Drug Use: No  . Sexual Activity: Not on file   Other Topics Concern  . Not on file   Social History Narrative    Review of Systems: See HPI, otherwise negative ROS   Physical Exam: BP 133/87 mmHg  Pulse 70  Temp(Src) 97.8 F (36.6 C) (Oral)  Resp 20  Ht 5' 1" (1.549 m)  Wt 101 lb (45.813 kg)  BMI 19.09 kg/m2  SpO2 99% General:   Alert,  pleasant and cooperative in NAD Head:  Normocephalic and atraumatic. Neck:  Supple; Lungs:  Clear throughout to auscultation.    Heart:  Regular rate and rhythm. Abdomen:   Soft, nontender and nondistended. Normal bowel sounds, without guarding, and without rebound.   Neurologic:  Alert and  oriented x4;  grossly normal neurologically.  Impression/Plan:    SCREENING  Plan:  1. TCS TODAY

## 2014-07-13 NOTE — Op Note (Signed)
[facility] [address] [place], [id number]   COLONOSCOPY PROCEDURE REPORT  PATIENT: Judith Sherman, Judith Sherman  MR#: [id number] BIRTHDATE: May 05, 1956 , 58  yrs. old GENDER: female ENDOSCOPIST: Jonette EvaSandi Aubria Vanecek, MD REFERRED XB:[id number]:Olugbemiga Hyman HopesJegede, M.D. PROCEDURE DATE:  07/12/2014 PROCEDURE:   Colonoscopy with biopsy INDICATIONS:average risk for colon cancer. MEDICATIONS: Demerol 50 mg IV and Versed 4 mg IV  DESCRIPTION OF PROCEDURE:    Physical exam was performed.  Informed consent was obtained from the patient after explaining the benefits, risks, and alternatives to procedure.  The patient was connected to monitor and placed in left lateral position. Continuous oxygen was provided by nasal cannula and IV medicine administered through an indwelling cannula.  After administration of sedation and rectal exam, the patients rectum was intubated and the EC-[id number] ([id number])  colonoscope was advanced under direct visualization to the ileum.  The scope was removed slowly by carefully examining the color, texture, anatomy, and integrity mucosa on the way out.  The patient was recovered in endoscopy and discharged home in satisfactory condition.      COLON FINDINGS: A small non-bleeding and shallow ulcer was found in the terminal ileum.  Biopsies were taken around the ulcer.  , The colonic mucosa appeared normal.  , and Small internal hemorrhoids were found.  PREP QUALITY: excellent. CECAL W/D TIME: 123  minutes  COMPLICATIONS: None  ENDOSCOPIC IMPRESSION: 1.   Small ulcer in the terminal ileum 2.   Small internal hemorrhoids  RECOMMENDATIONS: FOLLOW A HIGH FIBER DIET. AWAIT BIOPSY NEXT COLONOSCOPY IN 10 YEARS.      _______________________________ eSignedJonette Eva:  Kristofer Schaffert, MD 07/13/2014 6:45 PM   CPT CODES: ICD CODES:  The ICD and CPT codes recommended by this software are interpretations from the data that the clinical staff has captured with the software.  The  verification of the translation of this report to the ICD and CPT codes and modifiers is the sole responsibility of the health care institution and practicing physician where this report was generated.  [employer] will not be held responsible for the validity of the ICD and CPT codes included on this report.  AMA assumes no liability for data contained or not contained herein. CPT is a Publishing rights managerregistered trademark of the [employer].

## 2014-07-19 ENCOUNTER — Encounter (HOSPITAL_COMMUNITY): Payer: Self-pay | Admitting: Gastroenterology

## 2014-08-16 ENCOUNTER — Encounter: Payer: Self-pay | Admitting: Internal Medicine

## 2014-08-16 ENCOUNTER — Ambulatory Visit: Payer: No Typology Code available for payment source | Attending: Internal Medicine | Admitting: Internal Medicine

## 2014-08-16 VITALS — BP 137/85 | HR 73 | Temp 98.0°F | Resp 16

## 2014-08-16 DIAGNOSIS — Z79899 Other long term (current) drug therapy: Secondary | ICD-10-CM | POA: Insufficient documentation

## 2014-08-16 DIAGNOSIS — K219 Gastro-esophageal reflux disease without esophagitis: Secondary | ICD-10-CM | POA: Insufficient documentation

## 2014-08-16 DIAGNOSIS — E785 Hyperlipidemia, unspecified: Secondary | ICD-10-CM | POA: Insufficient documentation

## 2014-08-16 LAB — COMPLETE METABOLIC PANEL WITH GFR
ALK PHOS: 76 U/L (ref 39–117)
ALT: 16 U/L (ref 0–35)
AST: 21 U/L (ref 0–37)
Albumin: 4.5 g/dL (ref 3.5–5.2)
BILIRUBIN TOTAL: 0.5 mg/dL (ref 0.2–1.2)
BUN: 14 mg/dL (ref 6–23)
CHLORIDE: 103 meq/L (ref 96–112)
CO2: 28 mEq/L (ref 19–32)
Calcium: 10.4 mg/dL (ref 8.4–10.5)
Creat: 0.62 mg/dL (ref 0.50–1.10)
GLUCOSE: 103 mg/dL — AB (ref 70–99)
POTASSIUM: 5.8 meq/L — AB (ref 3.5–5.3)
Sodium: 142 mEq/L (ref 135–145)
Total Protein: 8.1 g/dL (ref 6.0–8.3)

## 2014-08-16 LAB — LIPID PANEL
Cholesterol: 276 mg/dL — ABNORMAL HIGH (ref 0–200)
HDL: 63 mg/dL (ref >39)
LDL Cholesterol: 164 mg/dL — ABNORMAL HIGH (ref 0–99)
TRIGLYCERIDES: 244 mg/dL — AB (ref <150)
Total CHOL/HDL Ratio: 4.4 Ratio
VLDL: 49 mg/dL — AB (ref 0–40)

## 2014-08-16 MED ORDER — SIMVASTATIN 40 MG PO TABS: 40.0000 mg | ORAL_TABLET | Freq: Every day | ORAL | Status: DC

## 2014-08-16 NOTE — Progress Notes (Signed)
Patient ID: Judith Sherman Rando, female   DOB: May 08, 1956, 59 y.o.   MRN: [id number]   Judith Sherman Corey, is a 59 y.o. female  [id number]  [id number]  DOB - May 08, 1956  No chief complaint on file.       Subjective:   Judith Sherman Berggren is a 59 y.o. female here today for a follow up visit. Patient has history of hyperlipidemia and GERD. Her medications include simvastatin 40 mg tablet by mouth daily, pantoprazole 40 mg tablet by mouth daily and occasional ranitidine from over-the-counter. She has no complaints today. She needs refill on her simvastatin. Her last colonoscopy was in December 2015, normal. She needs a Pap smear. Patient has No headache, No chest pain, No abdominal pain - No Nausea, No new weakness tingling or numbness, No Cough - SOB.  No problems updated.  ALLERGIES: No Known Allergies  PAST MEDICAL HISTORY: Past Medical History  Diagnosis Date  . Hyperlipidemia     MEDICATIONS AT HOME: Prior to Admission medications   Medication Sig Start Date End Date Taking? Authorizing Provider  pantoprazole (PROTONIX) 40 MG tablet Take 1 tablet (40 mg total) by mouth daily. 05/06/14  Yes Quentin Angstlugbemiga E Rivkah Wolz, MD  Ranitidine HCl (ZANTAC PO) Take 1 tablet by mouth daily as needed (heartburn).    Yes Historical Provider, MD  simvastatin (ZOCOR) 40 MG tablet Take 1 tablet (40 mg total) by mouth at bedtime. 08/16/14  Yes Quentin Angstlugbemiga E Danaria Larsen, MD     Objective:   Filed Vitals:   08/16/14 1434  BP: 137/85  Pulse: 73  Temp: 98 F (36.7 C)  TempSrc: Oral  Resp: 16  SpO2: 100%    Exam General appearance : Awake, alert, not in any distress. Speech Clear. Not toxic looking HEENT: Atraumatic and Normocephalic, pupils equally reactive to light and accomodation Neck: supple, no JVD. No cervical lymphadenopathy.  Chest:Good air entry bilaterally, no added sounds  CVS: S1 S2 regular, no murmurs.  Abdomen: Bowel sounds present, Non tender and not distended with no gaurding, rigidity or  rebound. Extremities: B/L Lower Ext shows no edema, both legs are warm to touch Neurology: Awake alert, and oriented X 3, CN II-XII intact, Non focal Skin:No Rash Wounds:N/A  Data Review Lab Results  Component Value Date   HGBA1C 5.5 01/12/2014     Assessment & Plan   1. Dyslipidemia  - COMPLETE METABOLIC PANEL WITH GFR - Lipid panel  - simvastatin (ZOCOR) 40 MG tablet; Take 1 tablet (40 mg total) by mouth at bedtime.  Dispense: 90 tablet; Refill: 3   Patient was counseled extensively about nutrition and exercise.   Interpreter was used to communicate directly with patient for the entire encounter including providing detailed patient instructions.  Return in about 6 months (around 02/14/2015) for Dyslipidemia, Pap Smear.  The patient was given clear instructions to go to ER or return to medical center if symptoms don't improve, worsen or new problems develop. The patient verbalized understanding. The patient was told to call to get lab results if they haven't heard anything in the next week.   This note has been created with Education officer, environmentalDragon speech recognition software and smart phrase technology. Any transcriptional errors are unintentional.    Jeanann LewandowskyJEGEDE, Zaine Elsass, MD, MHA, FACP, FAAP [facility] [place], [place] [phone]   08/16/2014, 3:08 PM

## 2014-08-16 NOTE — Progress Notes (Signed)
Pt is here following up on her GERD and her hyperlipidemia. Pt has no C.C. Today.

## 2014-08-19 ENCOUNTER — Ambulatory Visit: Payer: No Typology Code available for payment source | Attending: Internal Medicine

## 2014-08-19 DIAGNOSIS — Z0000 Encounter for general adult medical examination without abnormal findings: Secondary | ICD-10-CM

## 2014-08-19 LAB — BASIC METABOLIC PANEL
BUN: 12 mg/dL (ref 6–23)
CO2: 29 mEq/L (ref 19–32)
Calcium: 9.1 mg/dL (ref 8.4–10.5)
Chloride: 103 mEq/L (ref 96–112)
Creat: 0.6 mg/dL (ref 0.50–1.10)
Glucose, Bld: 85 mg/dL (ref 70–99)
POTASSIUM: 3.9 meq/L (ref 3.5–5.3)
SODIUM: 141 meq/L (ref 135–145)

## 2014-08-25 ENCOUNTER — Telehealth: Payer: Self-pay | Admitting: Gastroenterology

## 2014-08-25 ENCOUNTER — Encounter: Payer: Self-pay | Admitting: Gastroenterology

## 2014-08-25 NOTE — Telephone Encounter (Signed)
PLEASE CALL PT. HER SMALL BOWEL ULCER IS BENIGN. IT IS LIKE DUE TO MEDS OR FOOD.   FOLLOW A HIGH FIBER DIET. OPV IN MAR OR APR 2016 TO RE-ASSESS E30 RLQ ABDOMINAL PAIN. NEXT COLONOSCOPY IN 10 YEARS.

## 2014-08-25 NOTE — Telephone Encounter (Signed)
APPOINTMENT MADE AND LETTER SENT  ON RECALL FOR 10 YR TCS

## 2014-08-26 NOTE — Telephone Encounter (Signed)
LMOM to call.

## 2014-09-02 NOTE — Telephone Encounter (Signed)
Pt is aware of results. 

## 2014-09-27 ENCOUNTER — Encounter: Payer: Self-pay | Admitting: Gastroenterology

## 2014-10-06 ENCOUNTER — Ambulatory Visit: Payer: No Typology Code available for payment source

## 2014-10-20 ENCOUNTER — Encounter: Payer: Self-pay | Admitting: Gastroenterology

## 2014-10-20 ENCOUNTER — Ambulatory Visit (INDEPENDENT_AMBULATORY_CARE_PROVIDER_SITE_OTHER): Payer: Self-pay | Admitting: Gastroenterology

## 2014-10-20 VITALS — BP 122/70 | HR 68 | Temp 97.6°F | Ht 62.0 in | Wt 104.2 lb

## 2014-10-20 DIAGNOSIS — R1031 Right lower quadrant pain: Secondary | ICD-10-CM

## 2014-10-20 DIAGNOSIS — K219 Gastro-esophageal reflux disease without esophagitis: Secondary | ICD-10-CM

## 2014-10-20 MED ORDER — PANTOPRAZOLE SODIUM 40 MG PO TBEC: 40.0000 mg | DELAYED_RELEASE_TABLET | Freq: Every day | ORAL | Status: DC

## 2014-10-20 NOTE — Progress Notes (Signed)
CC'ED TO PCP 

## 2014-10-20 NOTE — Assessment & Plan Note (Signed)
Intermittent symptoms, takes Zantac when necessary. 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 Really denies any significant symptoms at this time. Of note, patient previously felt her abdominal pain improved with use of protonix. Abdominal pain predominantly in the right mid to right lower quadrant however. Recently found to have a shallow benign ulcer in distal TI possibly related to medication or food. Nothing to suspect IBD.  For now patient will resume Protonix 40 mg daily. Continue until pain completely resolved. If she does not improve she'll let me know. Otherwise she'll come back in 6 months for follow-up.

## 2014-10-20 NOTE — Progress Notes (Signed)
Primary Care Physician: Jeanann Lewandowsky, MD  Primary Gastroenterologist:  Jonette Eva, MD   Chief Complaint  Patient presents with  . Follow-up    states she is better    HPI: Judith Sherman is a 59 y.o. female here for follow-up of chronic intermittent right lower quadrant pain. Has felt protonix helps this pain.  At time of last office visit patient requested colonoscopy stating she was overdue. She had colonoscopy in December 2015 for average risk colon cancer screening. She has small nonbleeding and shallow ulcer found in the terminal ileum (pathology benign-thought to be medication or food related), small internal hemorrhoids. Advised the next colonoscopy should be in 10 years. She reports EGD a couple years ago in [place] and was told was normal. 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 In the past medical history of her PCP note reports normal EGD January 2013 although I do not have the actual op note. CT scan in October 2015 with no explanation for her right lower quadrant abdominal pain.  Patient presented without interpreter today however we did have [place] interpreter by phone, [place] [phone]. Patient states that she is doing 90% better. Abdominal pain nearly resolved, some days without any pain. Bowel function is normal. Denies any blood in the stool or melena. Appetite is good. Denies vomiting. Takes Zantac as needed for heartburn. Currently not on pantoprazole because she was doing much better. Denies any NSAID or aspirin use.  Weight improved.  Current Outpatient Prescriptions  Medication Sig Dispense Refill  . pantoprazole (PROTONIX) 40 MG tablet Take 1 tablet (40 mg total) by mouth daily. (Patient taking differently: Take 40 mg by mouth daily as needed. ) 30 tablet 3  . Ranitidine HCl (ZANTAC PO) Take 1 tablet by mouth daily as needed (heartburn states she takes only as needed).     .  simvastatin (ZOCOR) 40 MG tablet Take 1 tablet (40 mg total) by mouth at bedtime. 90 tablet 3   No current facility-administered medications for this visit.    Allergies as of 10/20/2014  . (No Known Allergies)   Past Surgical History  Procedure Laterality Date  . Breast lumpectomy      benign  . Colonoscopy N/A 07/12/2014    SLF:  1. small ulcer in teh terminal ileum 2. Small internal hemorrhoids.  . Tonsillectomy      ROS:  General: Negative for anorexia, weight loss, fever, chills, fatigue, weakness. ENT: Negative for hoarseness, difficulty swallowing , nasal congestion. CV: Negative for chest pain, angina, palpitations, dyspnea on exertion, peripheral edema.  Respiratory: Negative for dyspnea at rest, dyspnea on exertion, cough, sputum, wheezing.  GI: See history of present illness. GU:  Negative for dysuria, hematuria, urinary incontinence, urinary frequency, nocturnal urination.  Endo: Negative for unusual weight change.    Physical Examination:   BP 122/70 mmHg  Pulse 68  Temp(Src) 97.6 F (36.4 C)  Ht  (1.575 m)  Wt 104 lb 3.2 oz (47.265 kg)  BMI 19.05 kg/m2  General: Well-nourished, well-developed in no acute distress.  Eyes: No icterus. Mouth: Oropharyngeal mucosa moist and pink , no lesions erythema or exudate. Lungs: Clear to auscultation bilaterally.  Heart: Regular rate and rhythm, no murmurs rubs or gallops.  Abdomen: Bowel sounds are normal, minimal tenderness in the right midabdomen, nondistended, no hepatosplenomegaly or masses, no abdominal bruits or hernia , no rebound or guarding.  Extremities: No lower extremity edema. No clubbing or deformities. Neuro: Alert and oriented x 4   Skin: Warm and dry, no jaundice.   Psych: Alert and cooperative, normal mood and affect.

## 2014-10-20 NOTE — Patient Instructions (Signed)
1. Please start back on Pantoprazole 40mg  one pill daily before breakfast. As long as you are having any abdominal pain I want you to take this. 2. Call if you have recurrent or persistent abdominal pain. 3. Return to the office in six months for a check up.

## 2015-01-19 ENCOUNTER — Ambulatory Visit: Payer: Self-pay | Attending: Internal Medicine

## 2015-02-22 ENCOUNTER — Encounter: Payer: Self-pay | Admitting: Gastroenterology

## 2015-04-21 ENCOUNTER — Other Ambulatory Visit: Payer: Self-pay

## 2015-04-21 DIAGNOSIS — Z1231 Encounter for screening mammogram for malignant neoplasm of breast: Secondary | ICD-10-CM

## 2015-05-06 ENCOUNTER — Encounter (HOSPITAL_COMMUNITY): Payer: Self-pay | Admitting: Emergency Medicine

## 2015-05-06 ENCOUNTER — Emergency Department (INDEPENDENT_AMBULATORY_CARE_PROVIDER_SITE_OTHER): Payer: Self-pay

## 2015-05-06 ENCOUNTER — Emergency Department (INDEPENDENT_AMBULATORY_CARE_PROVIDER_SITE_OTHER)
Admission: EM | Admit: 2015-05-06 | Discharge: 2015-05-06 | Disposition: A | Discharge status: Home / Self Care | Payer: Self-pay | Source: Home / Self Care

## 2015-05-06 DIAGNOSIS — M4727 Other spondylosis with radiculopathy, lumbosacral region: Secondary | ICD-10-CM

## 2015-05-06 MED ORDER — ACETAMINOPHEN 500 MG PO TABS: 1000.0000 mg | ORAL_TABLET | Freq: Three times a day (TID) | ORAL | Status: DC | PRN

## 2015-05-06 NOTE — Discharge Instructions (Signed)
Arthritis Arthritis is a term that is commonly used to refer to joint pain or joint disease. There are more than 100 types of arthritis. CAUSES The most common cause of this condition is wear and tear of a joint.  In some cases, the cause may not be known. SYMPTOMS The main symptom of this condition is pain in the joint with movement. Other symptoms include:  Redness, swelling, or stiffness at a joint.  Warmth coming from the joint.  Fever.  Overall feeling of illness. DIAGNOSIS This condition may be diagnosed with a physical exam and tests, including:  Blood tests.  Urine tests.  Imaging tests, such as MRI, X-rays, or a CT scan. Sometimes, fluid is removed from a joint for testing. TREATMENT Treatment for this condition may involve:  Treatment of the cause, if it is known.  Rest.  Raising (elevating) the joint.  Applying cold or hot packs to the joint.  Medicines to improve symptoms and reduce inflammation.  Injections of a steroid such as cortisone into the joint to help reduce pain and inflammation. Depending on the cause of your arthritis, you may need to make lifestyle changes to reduce stress on your joint. These changes may include exercising more and losing weight. HOME CARE INSTRUCTIONS Medicines  Take over-the-counter and prescription medicines only as told by your health care provider.  Do not take aspirin to relieve pain if gout is suspected. Activities  Rest your joint if told by your health care provider. Rest is important when your disease is active and your joint feels painful, swollen, or stiff.  Avoid activities that make the pain worse. It is important to balance activity with rest.  Exercise your joint regularly with range-of-motion exercises as told by your health care provider. Try doing low-impact exercise, such as:  Swimming.  Water aerobics.  Biking.  Walking. Joint Care  If your joint is swollen, keep it elevated if told by your  health care provider.  If your joint feels stiff in the morning, try taking a warm shower.  If directed, apply heat to the joint. If you have diabetes, do not apply heat without permission from your health care provider.  Put a towel between the joint and the hot pack or heating pad.  Leave the heat on the area for 20-30 minutes.  If directed, apply ice to the joint:  Put ice in a plastic bag.  Place a towel between your skin and the bag.  Leave the ice on for 20 minutes, 2-3 times per day.  Keep all follow-up visits as told by your health care provider. This is important. SEEK MEDICAL CARE IF:  The pain gets worse.  You have a fever. SEEK IMMEDIATE MEDICAL CARE IF:  You develop severe joint pain, swelling, or redness.  Many joints become painful and swollen.  You develop severe back pain.  You develop severe weakness in your leg.  You cannot control your bladder or bowels.   This information is not intended to replace advice given to you by your health care provider. Make sure you discuss any questions you have with your health care provider.   Document Released: 08/16/2004 Document Revised: 03/30/2015 Document Reviewed: 10/04/2014 Elsevier Interactive Patient Education [employer]2016 Elsevier Inc.

## 2015-05-06 NOTE — ED Provider Notes (Signed)
CSN: [id number]     Arrival date & time 05/06/15  1304 History   None    Chief Complaint  Patient presents with  . Leg Pain  . Hip Pain   (Consider location/radiation/quality/duration/timing/severity/associated sxs/prior Treatment) Patient is a 59 y.o. female presenting with leg pain.  Leg Pain Location:  Leg and buttock Time since incident:  5 months Buttock location:  L buttock Leg location:  L leg Pain details:    Quality:  Shooting and sharp   Severity:  Moderate Relieved by:  Acetaminophen, NSAIDs, ice and heat Associated symptoms: no back pain, no decreased ROM and no neck pain     Past Medical History  Diagnosis Date  . Hyperlipidemia   . LPRD (laryngopharyngeal reflux disease)     Evaluated by NWG:[facility]  . Allergic rhinitis    Past Surgical History  Procedure Laterality Date  . Breast lumpectomy      benign  . Colonoscopy N/A 07/12/2014    SLF:  1. small ulcer in teh terminal ileum 2. Small internal hemorrhoids.  . Tonsillectomy     Family History  Problem Relation Age of Onset  . Colon cancer Neg Hx    Social History  Substance Use Topics  . Smoking status: Never Smoker   . Smokeless tobacco: None  . Alcohol Use: No   OB History    No data available     Review of Systems  Constitutional: Negative for activity change and appetite change.  Gastrointestinal: Negative for abdominal pain.  Genitourinary: Negative for dysuria, hematuria, decreased urine volume and difficulty urinating.  Musculoskeletal: Positive for myalgias. Negative for back pain, gait problem and neck pain.  Neurological: Negative for dizziness, weakness and numbness.    Allergies  Aspirin  Home Medications   Prior to Admission medications   Medication Sig Start Date End Date Taking? Authorizing Provider  simvastatin (ZOCOR) 40 MG tablet Take 1 tablet (40 mg total) by mouth at bedtime. 08/16/14  Yes Quentin Angstlugbemiga E Jegede, MD  acetaminophen (TYLENOL) 500  MG tablet Take 2 tablets (1,000 mg total) by mouth every 8 (eight) hours as needed. Take 2 tabs every 8 hours. 05/06/15   Ofilia NeasMichael L Clark, PA-C  pantoprazole (PROTONIX) 40 MG tablet Take 1 tablet (40 mg total) by mouth daily. 10/20/14   Tiffany KocherLeslie S Lewis, PA-C  Ranitidine HCl (ZANTAC PO) Take 1 tablet by mouth daily as needed (heartburn states she takes only as needed).     Historical Provider, MD   Meds Ordered and Administered this Visit  Medications - No data to display  BP 117/78 mmHg  Pulse 73  Temp(Src) 98.2 F (36.8 C) (Oral)  Resp 16  SpO2 100% No data found.   Physical Exam  Constitutional: She is oriented to person, place, and time. She appears well-developed and well-nourished. No distress.  Cardiovascular: Normal rate and regular rhythm.   Pulmonary/Chest: Effort normal.  Abdominal: Soft.  Musculoskeletal: Normal range of motion.  Neurological: She is alert and oriented to person, place, and time. She has normal strength. She displays no atrophy and no tremor. No cranial nerve deficit or sensory deficit. She exhibits normal muscle tone. She displays no seizure activity. Coordination and gait normal.  Reflex Scores:      Patellar reflexes are 2+ on the right side and 2+ on the left side.      Achilles reflexes are 2+ on the right side and 2+ on the left side. Vitals reviewed.   ED Course  Procedures (including critical care time)  Labs Review Labs Reviewed - No data to display  Imaging Review Dg Lumbar Spine Complete  05/06/2015  CLINICAL DATA:  Low back pain for 5 months. No recent injury. Fall 2 years ago. EXAM: LUMBAR SPINE - COMPLETE 4+ VIEW COMPARISON:  CT 05/10/2014 FINDINGS: Mild degenerative disc disease at L5-S1 with disc space narrowing. Spurring anteriorly throughout the lumbar spine. Degenerative facet disease at L4-5 and L5-S1. Partial sacralization of L5. No acute bony abnormality. No malalignment or fracture. IMPRESSION: Spondylosis.  No acute bony  abnormality. Electronically Signed   By: Charlett Nose M.D.   On: 05/06/2015 14:46   Dg Sacrum/coccyx  05/06/2015  CLINICAL DATA:  Chronic pain with no recent trauma EXAM: SACRUM AND COCCYX - 2+ VIEW COMPARISON:  None. FINDINGS: Frontal and lateral views were obtained. There is no acute fracture or diastases. No sacroiliitis. Joint spaces appear intact. Mild disc space narrowing is noted at L5-S1. IMPRESSION: No appreciable arthropathic change. No acute fracture or diastases. Mild disc space narrowing noted at L5-S1. Electronically Signed   By: Bretta Bang III M.D.   On: 05/06/2015 14:33         MDM   1. Osteoarthritis of spine with radiculopathy, lumbosacral region    Confirmed with radiograph.  Patient to follow up with PCP.  No weakness and numbness today. Will refer to physical therapy. Tylenol for now.     Ofilia Neas, PA-C 05/06/15 [phone]

## 2015-05-06 NOTE — ED Notes (Signed)
Pt is here today for a 5 month history of pain that started in her left lower leg that is now radiating up her leg to her left hip.  She walks daily and does yoga, and has been taking Tylenol and Advil, but nothing seems to help.  She is hoping to get a referral for PT to help make her better.  She is also complaining of itching after she has a bowel movement.

## 2015-05-16 ENCOUNTER — Ambulatory Visit: Payer: Self-pay

## 2015-05-20 ENCOUNTER — Encounter: Payer: Self-pay | Admitting: Family Medicine

## 2015-05-20 ENCOUNTER — Other Ambulatory Visit: Payer: Self-pay | Admitting: Family Medicine

## 2015-05-20 ENCOUNTER — Ambulatory Visit: Payer: Self-pay | Attending: Family Medicine | Admitting: Family Medicine

## 2015-05-20 VITALS — BP 135/81 | HR 81 | Temp 98.0°F | Resp 16 | Ht 61.0 in | Wt 101.0 lb

## 2015-05-20 DIAGNOSIS — M5136 Other intervertebral disc degeneration, lumbar region: Secondary | ICD-10-CM

## 2015-05-20 DIAGNOSIS — L290 Pruritus ani: Secondary | ICD-10-CM

## 2015-05-20 DIAGNOSIS — K219 Gastro-esophageal reflux disease without esophagitis: Secondary | ICD-10-CM

## 2015-05-20 DIAGNOSIS — E785 Hyperlipidemia, unspecified: Secondary | ICD-10-CM

## 2015-05-20 MED ORDER — MELOXICAM 7.5 MG PO TABS: 7.5000 mg | ORAL_TABLET | Freq: Every day | ORAL | Status: DC

## 2015-05-20 MED ORDER — HYDROCORTISONE ACE-PRAMOXINE 2.5-1 % RE CREA: 1.0000 "application " | TOPICAL_CREAM | Freq: Three times a day (TID) | RECTAL | Status: DC

## 2015-05-20 MED ORDER — HYDROCORTISONE 2.5 % RE CREA: 1.0000 "application " | TOPICAL_CREAM | Freq: Two times a day (BID) | RECTAL | Status: DC

## 2015-05-20 MED ORDER — SIMVASTATIN 40 MG PO TABS: 40.0000 mg | ORAL_TABLET | Freq: Every day | ORAL | Status: DC

## 2015-05-20 MED ORDER — PANTOPRAZOLE SODIUM 40 MG PO TBEC: 40.0000 mg | DELAYED_RELEASE_TABLET | Freq: Every day | ORAL | Status: DC

## 2015-05-20 NOTE — Patient Instructions (Signed)

## 2015-05-20 NOTE — Progress Notes (Signed)
Pt's here for HFU for Osteoarthritis. Pt reports having in left hip described as a dull rated at 7/10.  Pt states that she itchy in anus area [phone].Marland Kitchen.  Pt requesting referral for Physical Therapy.

## 2015-05-20 NOTE — Progress Notes (Signed)
CC:  HPI: Judith Sherman is a 59 y.o. female  is a history of GERD, hyperlipidemia who [facility] for low back pain radiating to her left posterior thigh. She was was given tylenol which she never filled as she states she took it previously with no relief.  Patient still complains of low back pain which is a 9/10. Denies numbness, weakness or lower extremity weakness  Also complains of pruritus in rectal area for the last 3 months and previously had similar symptoms while she was in [place] and was treated for "a worm"  Compliant with PPI for GERD and Statin for Hyperlipidemia.  Patient has No headache, No chest pain, No abdominal pain - No Nausea, No new weakness tingling or numbness, No Cough - SOB.  Allergies  Allergen Reactions  . Aspirin    Past Medical History  Diagnosis Date  . Hyperlipidemia   . LPRD (laryngopharyngeal reflux disease)     [facility] by ZOX:[facility]  . Allergic rhinitis    Current Outpatient Prescriptions on File Prior to Visit  Medication Sig Dispense Refill  . acetaminophen (TYLENOL) 500 MG tablet Take 2 tablets (1,000 mg total) by mouth every 8 (eight) hours as needed. Take 2 tabs every 8 hours. 30 tablet 0  . pantoprazole (PROTONIX) 40 MG tablet Take 1 tablet (40 mg total) by mouth daily. 30 tablet 3  . Ranitidine HCl (ZANTAC PO) Take 1 tablet by mouth daily as needed (heartburn states she takes only as needed).     . simvastatin (ZOCOR) 40 MG tablet Take 1 tablet (40 mg total) by mouth at bedtime. 90 tablet 3   No current facility-administered medications on file prior to visit.   Family History  Problem Relation Age of Onset  . Colon cancer Neg Hx    Social History   Social History  . Marital Status: Divorced    Spouse Name: N/A  . Number of Children: 0  . Years of Education: N/A   Occupational History  . unemployed    Social History Main Topics  . Smoking status: Never  Smoker   . Smokeless tobacco: Not on file  . Alcohol Use: No  . Drug Use: No  . Sexual Activity: Not on file   Other Topics Concern  . Not on file   Social History Narrative    Review of Systems: Constitutional: Negative for fever, chills, diaphoresis, activity change, appetite change and fatigue. HENT: Negative for ear pain, nosebleeds, congestion, facial swelling, rhinorrhea, neck pain, neck stiffness and ear discharge.  Eyes: Negative for pain, discharge, redness, itching and visual disturbance. Respiratory: Negative for cough, choking, chest tightness, shortness of breath, wheezing and stridor.  Cardiovascular: Negative for chest pain, palpitations and leg swelling. Gastrointestinal: Negative for abdominal distention. Genitourinary: Negative for dysuria, urgency, frequency, hematuria, flank pain, decreased urine volume, difficulty urinating and dyspareunia.  Musculoskeletal: see hpi. Neurological: Negative for dizziness, tremors, seizures, syncope, facial asymmetry, speech difficulty, weakness, light-headedness, numbness and headaches.  Hematological: Negative for adenopathy. Does not bruise/bleed easily. Psychiatric/Behavioral: Negative for hallucinations, behavioral problems, confusion, dysphoric mood, decreased concentration and agitation.    Objective:\ Filed Vitals:   05/20/15 0903  BP: 135/81  Pulse: 81  Temp: 98 F (36.7 C)  TempSrc: Oral  Resp: 16  Height: 5\' 1"  (1.549 m)  Weight: 101 lb (45.813 kg)  SpO2: 100%      Physical Exam: Constitutional: Patient appears well-developed and well-nourished. No  distress. HENT: Normocephalic, atraumatic, External right and left ear normal. Oropharynx is clear and moist.  Eyes: Conjunctivae and EOM are normal. PERRLA, no scleral icterus. Neck: Normal ROM. Neck supple. No JVD. No tracheal deviation. No thyromegaly. CVS: RRR, S1/S2 +, no murmurs, no gallops, no carotid bruit.  Pulmonary: Effort and breath sounds normal, no  stridor, rhonchi, wheezes, rales.  Abdominal: Soft. BS +,  no distension, tenderness, rebound or guarding.  Musculoskeletal: Normal range of motion. No edema and no tenderness.  Lymphadenopathy: No lymphadenopathy noted, cervical, inguinal or axillary Neuro: Alert. Normal reflexes, muscle tone coordination. No cranial nerve deficit. Skin: Skin is warm and dry. No rash noted. Not diaphoretic. No erythema. No pallor. Psychiatric: Normal mood and affect. Behavior, judgment, thought content normal.  Lab Results  Component Value Date   WBC 5.4 01/12/2014   HGB 13.5 01/12/2014   HCT 39.3 01/12/2014   MCV 89.5 01/12/2014   PLT 328 01/12/2014   Lab Results  Component Value Date   CREATININE 0.60 08/19/2014   BUN 12 08/19/2014   NA 141 08/19/2014   K 3.9 08/19/2014   CL 103 08/19/2014   CO2 29 08/19/2014    Lab Results  Component Value Date   HGBA1C 5.5 01/12/2014   Lipid Panel     Component Value Date/Time   CHOL 276* 08/16/2014 1524   TRIG 244* 08/16/2014 1524   HDL 63 08/16/2014 1524   CHOLHDL 4.4 08/16/2014 1524   VLDL 49* 08/16/2014 1524   LDLCALC 164* 08/16/2014 1524       Assessment and plan:  Degenerative disease of the lumbar spine: Placed on meloxicam. Her chart revealed she is allergic to aspirin but on further questioning she informs me she gets nauseated with taking aspirin. Advised to apply heat.  Pruritus ani: Stool cultures sent off for ova and parasite.  meanwhile placed on rectal cream.   Hyperlipidemia: Lipid panel ordered again next Monday. Continue statin.  GERD : Doing well on PPI.  Jaclyn Shaggy, MD. [facility] [phone] 05/20/2015, 9:09 AM

## 2015-05-30 ENCOUNTER — Ambulatory Visit
Admission: RE | Admit: 2015-05-30 | Discharge: 2015-05-30 | Disposition: A | Discharge status: Home / Self Care | Payer: No Typology Code available for payment source | Source: Ambulatory Visit

## 2015-05-30 ENCOUNTER — Ambulatory Visit: Payer: Self-pay | Attending: Family Medicine

## 2015-05-30 DIAGNOSIS — E785 Hyperlipidemia, unspecified: Secondary | ICD-10-CM

## 2015-05-30 DIAGNOSIS — Z1231 Encounter for screening mammogram for malignant neoplasm of breast: Secondary | ICD-10-CM

## 2015-05-30 DIAGNOSIS — L290 Pruritus ani: Secondary | ICD-10-CM

## 2015-05-30 LAB — LIPID PANEL
CHOL/HDL RATIO: 2.7 ratio (ref <=5.0)
Cholesterol: 166 mg/dL (ref 125–200)
HDL: 62 mg/dL (ref >=46)
LDL Cholesterol: 90 mg/dL (ref <130)
Triglycerides: 72 mg/dL (ref <150)
VLDL: 14 mg/dL (ref <30)

## 2015-05-30 LAB — COMPREHENSIVE METABOLIC PANEL
ALT: 17 U/L (ref 6–29)
AST: 21 U/L (ref 10–35)
Albumin: 4 g/dL (ref 3.6–5.1)
Alkaline Phosphatase: 64 U/L (ref 33–130)
BUN: 13 mg/dL (ref 7–25)
CO2: 29 mmol/L (ref 20–31)
CREATININE: 0.55 mg/dL (ref 0.50–1.05)
Calcium: 9.1 mg/dL (ref 8.6–10.4)
Chloride: 103 mmol/L (ref 98–110)
GLUCOSE: 85 mg/dL (ref 65–99)
Potassium: 5 mmol/L (ref 3.5–5.3)
SODIUM: 142 mmol/L (ref 135–146)
Total Bilirubin: 0.8 mg/dL (ref 0.2–1.2)
Total Protein: 7.2 g/dL (ref 6.1–8.1)

## 2015-05-31 LAB — OVA AND PARASITE EXAMINATION: OP: NONE SEEN

## 2015-06-05 IMAGING — CT CT CHEST W/ CM
2 of 4 series · 15 of 36 positions shown, 18 images · IV contrast (APPLIED)
Comparison: None.

CLINICAL DATA: Chest pain, hemoptysis, night sweats

EXAM:
CT CHEST WITH CONTRAST
TECHNIQUE: Multidetector CT imaging of the chest was performed during
intravenous contrast administration.
CONTRAST:  80mL OMNIPAQUE IOHEXOL 300 MG/ML  SOLN

[Series 2: thorax 5.0 i31f 1 · axial · 0.58mm/px · z∈[-297,-57]mm · 12 of 58 slices shown, 15 images]
[im 5/58  mediastinal]
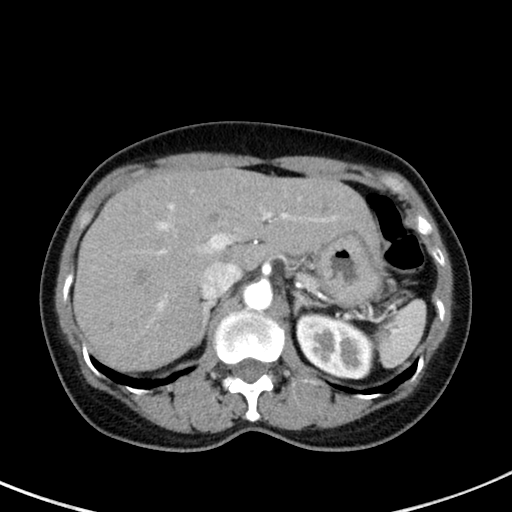
[im 5/58  lung]
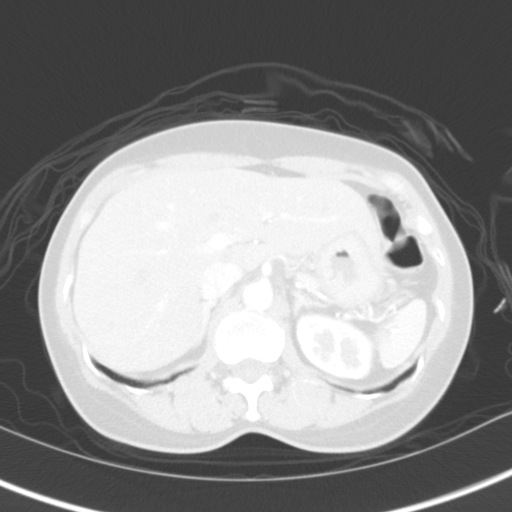
[im 9/58  lung]
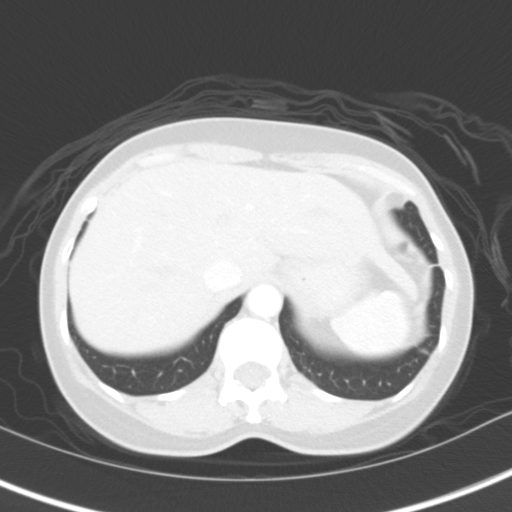
[im 14/58  lung]
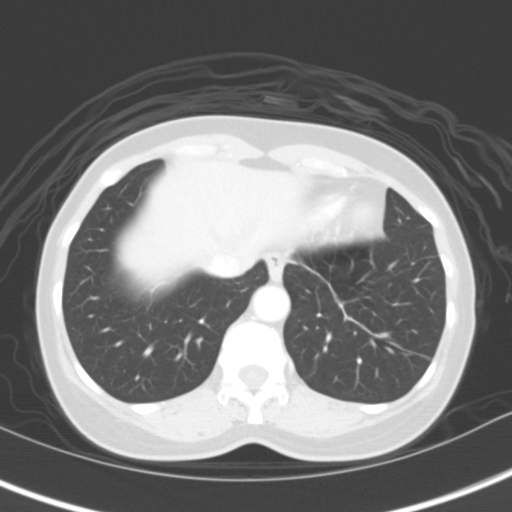
[im 18/58  lung]
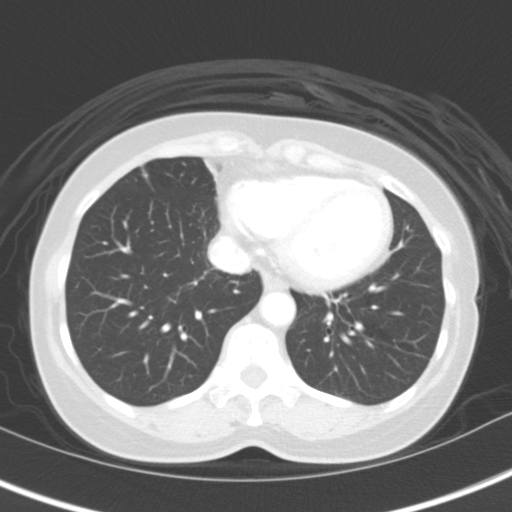
[im 22/58  mediastinal]
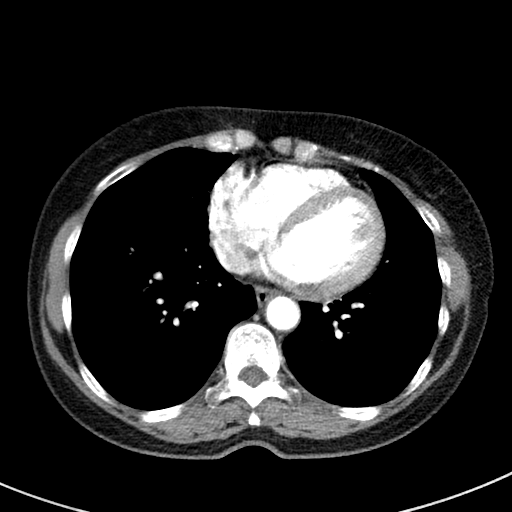
[im 22/58  lung]
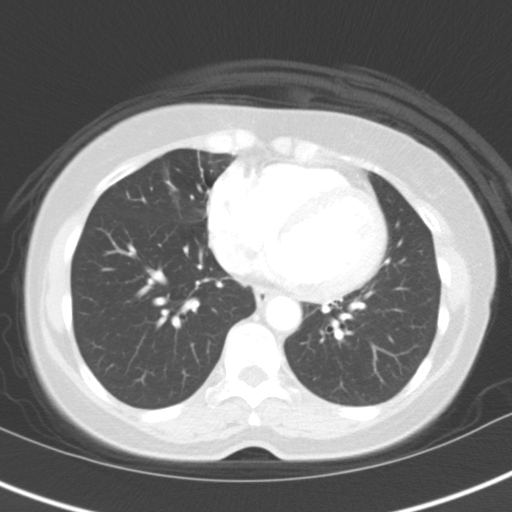
[im 27/58  lung]
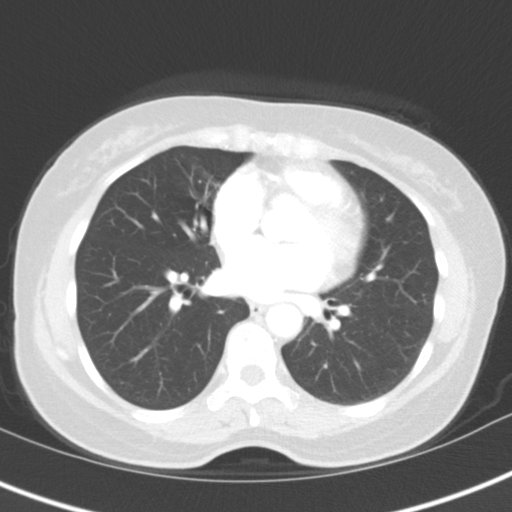
[im 31/58  lung]
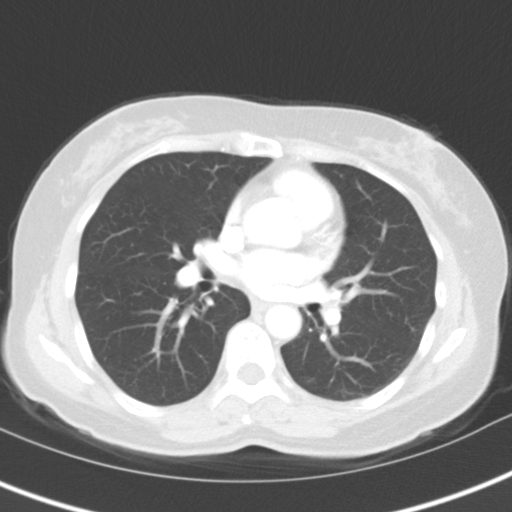
[im 36/58  lung]
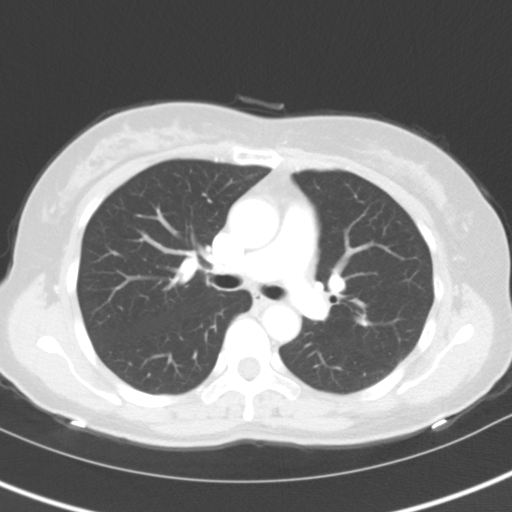
[im 40/58  mediastinal]
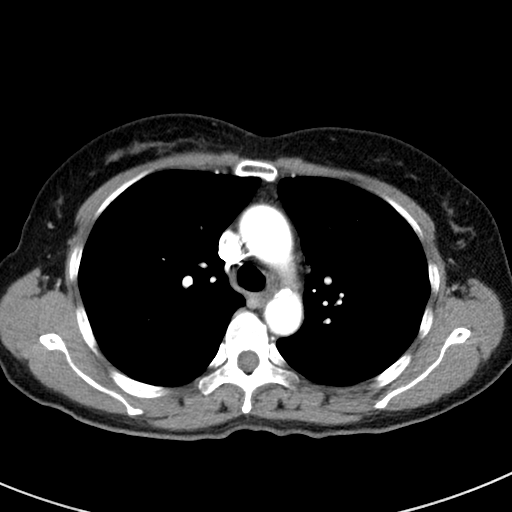
[im 40/58  lung]
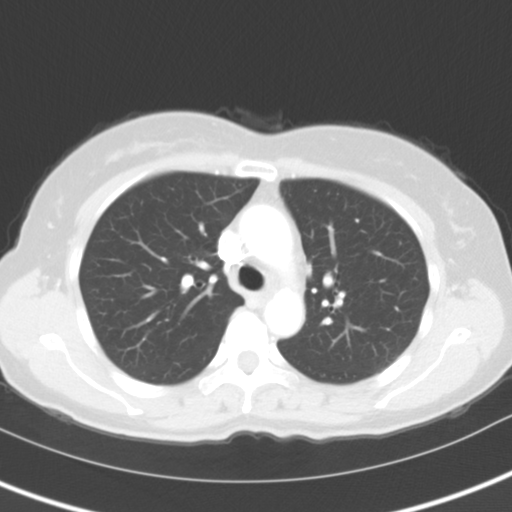
[im 44/58  lung]
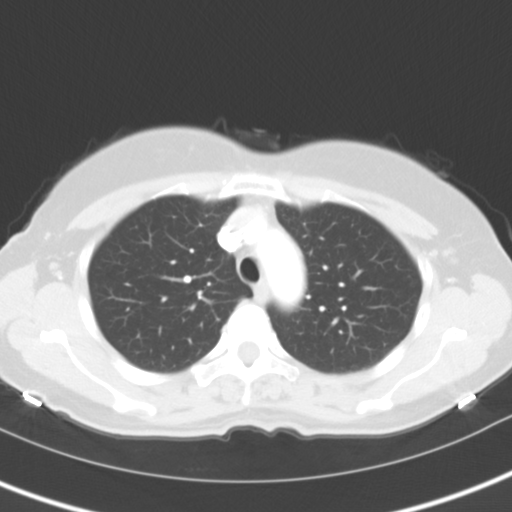
[im 49/58  lung]
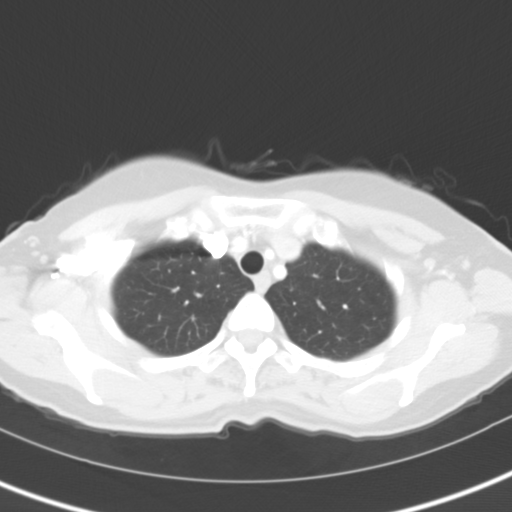
[im 53/58  lung]
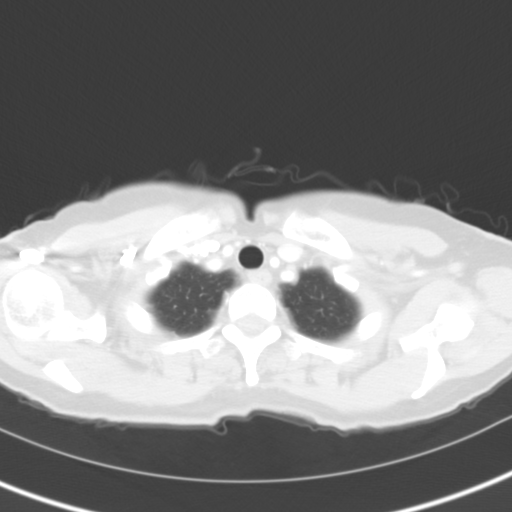

[Series 5: coronal · coronal · 0.59mm/px · 3 of 59 slices shown]
[im 12/59  lung]
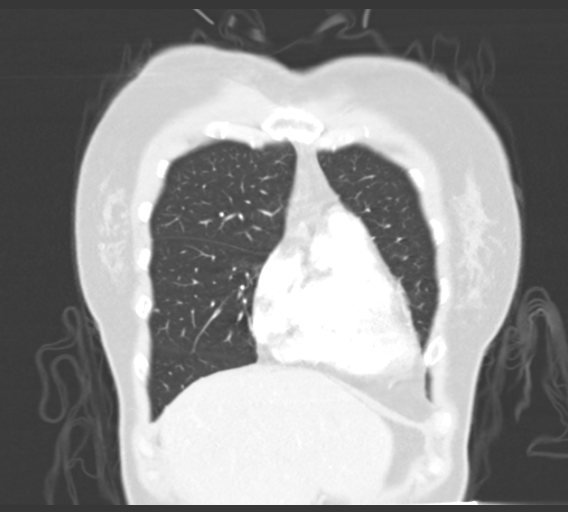
[im 24/59  lung]
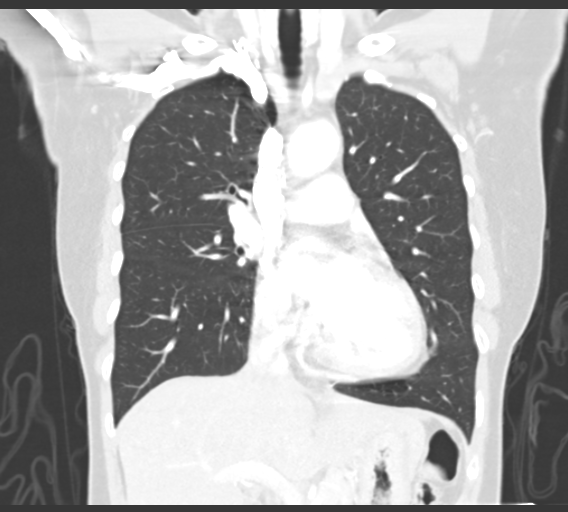
[im 35/59  lung]
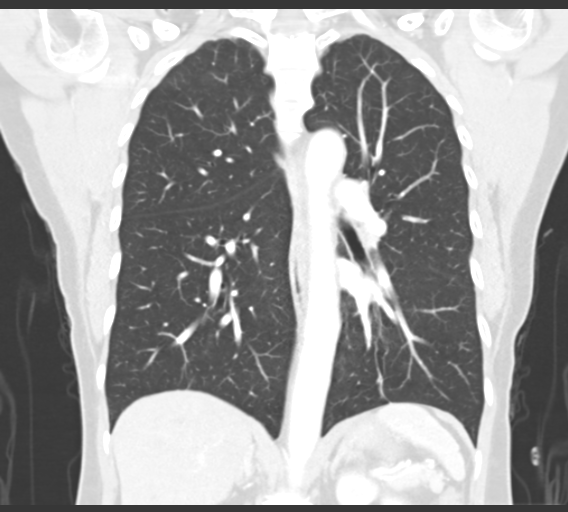

[15 of 36 positions shown; findings below may reference images not displayed]

FINDINGS: Lungs are clear except for mild subsegmental atelectasis at the
bases.

Mediastinal contents normal. Thoracic inlet normal. No hilar or
mediastinal adenopathy. No pleural or pericardial effusion.

No acute musculoskeletal findings. Images through the upper abdomen
are unremarkable.
IMPRESSION: Normal CT thorax.

## 2015-06-06 ENCOUNTER — Telehealth: Payer: Self-pay | Admitting: *Deleted

## 2015-06-06 NOTE — Telephone Encounter (Signed)
-----   Message from Quentin Angstlugbemiga E Jegede, MD sent at 06/01/2015  5:24 PM EST ----- Please inform patient that her screening mammogram shows no evidence of malignancy. Recommend screening mammogram in one year

## 2015-06-06 NOTE — Telephone Encounter (Signed)
Medical Assistant used [employer] to contact patient.  Interpreter Name: Drema DallasSteven Interpreter #: [phone]  Person stated patient is on vacation. Medical Assistant left message with person. Message states to give a call back to [place] [facility] at [phone].

## 2015-06-14 ENCOUNTER — Telehealth: Payer: Self-pay

## 2015-06-14 NOTE — Telephone Encounter (Signed)
CMA called [employer] Vanetta ShawlSuan [phone] answered. She called patient, but patient wasn't available to take my call. Message was left for patient to return my call.

## 2015-06-14 NOTE — Telephone Encounter (Signed)
-----   Message from Jaclyn ShaggyEnobong Amao, MD sent at 05/31/2015 11:19 PM EST ----- Please inform the patient that labs are normal. Thank you.

## 2015-06-21 NOTE — Telephone Encounter (Signed)
Patient will be sent a letter to address on file. 

## 2015-06-29 NOTE — Telephone Encounter (Signed)
-----   Message from Olugbemiga E Jegede, MD sent at 06/01/2015  5:24 PM EST ----- Please inform patient that her screening mammogram shows no evidence of malignancy. Recommend screening mammogram in one year 

## 2015-06-29 NOTE — Telephone Encounter (Signed)
Medical Assistant used [employer] to contact patient.  Interpreter Name: Lee Interpreter #: [id number]  Medical Assistant left message on patient's home and cell voicemail. Voicemail states to give a call back to Alger Kerstein with [facility] at [phone].  

## 2015-08-29 ENCOUNTER — Ambulatory Visit: Payer: No Typology Code available for payment source | Attending: Internal Medicine

## 2015-09-15 ENCOUNTER — Ambulatory Visit: Payer: Self-pay | Attending: Internal Medicine | Admitting: Internal Medicine

## 2015-09-15 ENCOUNTER — Encounter: Payer: Self-pay | Admitting: Internal Medicine

## 2015-09-15 VITALS — BP 118/75 | HR 81 | Temp 98.2°F | Resp 18 | Ht 61.0 in | Wt 103.2 lb

## 2015-09-15 DIAGNOSIS — Z886 Allergy status to analgesic agent status: Secondary | ICD-10-CM | POA: Insufficient documentation

## 2015-09-15 DIAGNOSIS — E785 Hyperlipidemia, unspecified: Secondary | ICD-10-CM | POA: Insufficient documentation

## 2015-09-15 DIAGNOSIS — M1612 Unilateral primary osteoarthritis, left hip: Secondary | ICD-10-CM | POA: Insufficient documentation

## 2015-09-15 DIAGNOSIS — K219 Gastro-esophageal reflux disease without esophagitis: Secondary | ICD-10-CM | POA: Insufficient documentation

## 2015-09-15 DIAGNOSIS — Z79899 Other long term (current) drug therapy: Secondary | ICD-10-CM | POA: Insufficient documentation

## 2015-09-15 DIAGNOSIS — R51 Headache: Secondary | ICD-10-CM | POA: Insufficient documentation

## 2015-09-15 DIAGNOSIS — H539 Unspecified visual disturbance: Secondary | ICD-10-CM | POA: Insufficient documentation

## 2015-09-15 MED ORDER — MELOXICAM 7.5 MG PO TABS: 7.5000 mg | ORAL_TABLET | Freq: Every day | ORAL | Status: DC

## 2015-09-15 MED ORDER — SIMVASTATIN 40 MG PO TABS: 40.0000 mg | ORAL_TABLET | Freq: Every day | ORAL | Status: DC

## 2015-09-15 NOTE — Progress Notes (Signed)
Patient is here for FU  Patient complains of pain in her Left Hip being present. Pain is currently scaled at a 7. Pain is described as being dull and constant.  Patient also complains of a HA along with seeing a "mosquito" out of the right side of her right eye.

## 2015-09-15 NOTE — Progress Notes (Signed)
Patient ID: Judith Sherman, female   DOB: [birth year], 60 y.o.   MRN: [id number]   Judith Sherman, is a 60 y.o. female  UVO:[id number]  [id number]  DOB - 06-02-1956  Chief Complaint  Patient presents with  . Follow-up        Subjective:   Judith Sherman is a 60 y.o. female with history of GERD and hyperlipidemia here today for a follow up visit. Her major complaint today is blurry vision and for the past 3 months, she has seen an object like mosquito fly obscuring her visual field on the right especially when she was the right side. When she looks better as the left, she does not see this object. She is very anxious and worried about these visual disturbances. Patient does not drive. She does not have gait abnormality, she does not see double vision. She has occasional headache, no nausea or vomiting and no other symptoms suggestive of increased intracranial pressure. However complaint is pain in her left hip that has been ongoing for years, diagnosed in the past as osteoarthritis. She is requesting pain medication. Patient does not smoke cigarettes, she does not drink alcohol. She does not use any illicit drugs. Patient has No chest pain, No abdominal pain - No Nausea, No new weakness tingling or numbness, No Cough - SOB.  Problem  Visual Disturbance    ALLERGIES: Allergies  Allergen Reactions  . Aspirin     PAST MEDICAL HISTORY: Past Medical History  Diagnosis Date  . Hyperlipidemia   . LPRD (laryngopharyngeal reflux disease)     Evaluated by EPP:[facility]  . Allergic rhinitis     MEDICATIONS AT HOME: Prior to Admission medications   Medication Sig Start Date End Date Taking? Authorizing Provider  acetaminophen (TYLENOL) 500 MG tablet Take 2 tablets (1,000 mg total) by mouth every 8 (eight) hours as needed. Take 2 tabs every 8 hours. 05/06/15  Yes Ofilia Neas, PA-C  hydrocortisone (ANUSOL-HC) 2.5 % rectal cream Place 1 application rectally 2 (two) times  daily. 05/20/15  Yes Jaclyn Shaggy, MD  hydrocortisone-pramoxine (ANALPRAM HC) 2.5-1 % rectal cream Place 1 application rectally 3 (three) times daily. 05/20/15  Yes Jaclyn Shaggy, MD  meloxicam (MOBIC) 7.5 MG tablet Take 1 tablet (7.5 mg total) by mouth daily. 09/15/15  Yes Quentin Angst, MD  pantoprazole (PROTONIX) 40 MG tablet Take 1 tablet (40 mg total) by mouth daily. 05/20/15  Yes Jaclyn Shaggy, MD  Ranitidine HCl (ZANTAC PO) Take 1 tablet by mouth daily as needed (heartburn states she takes only as needed).    Yes Historical Provider, MD  simvastatin (ZOCOR) 40 MG tablet Take 1 tablet (40 mg total) by mouth at bedtime. 09/15/15  Yes Quentin Angst, MD     Objective:   Filed Vitals:   09/15/15 1411  BP: 118/75  Pulse: 81  Temp: 98.2 F (36.8 C)  TempSrc: Oral  Resp: 18  Height:  (1.549 m)  Weight: 103 lb 3.2 oz (46.811 kg)  SpO2: 99%    Exam General appearance : Awake, alert, not in any distress. Speech Clear. Not toxic looking, thin built HEENT: Atraumatic and Normocephalic, pupils equally reactive to light and accomodation Neck: supple, no JVD. No cervical lymphadenopathy.  Chest:Good air entry bilaterally, no added sounds  CVS: S1 S2 regular, no murmurs.  Abdomen: Bowel sounds present, Non tender and not distended with no gaurding, rigidity or rebound. Extremities: B/L Lower Ext shows no edema, both legs are warm to  touch Neurology: Awake alert, and oriented X 3, CN II-XII intact, Non focal Skin:No Rash  Data Review Lab Results  Component Value Date   HGBA1C 5.5 01/12/2014     Assessment & Plan   1. Visual disturbance  - Ambulatory referral to Ophthalmology: Urgent  2. Dyslipidemia  - simvastatin (ZOCOR) 40 MG tablet; Take 1 tablet (40 mg total) by mouth at bedtime.  Dispense: 90 tablet; Refill: 3 To address this please limit saturated fat to no more than 7% of your calories, limit cholesterol to 200 mg/day, increase fiber and exercise as  tolerated. If needed we may add another cholesterol lowering medication to your regimen.   3. Primary osteoarthritis of left hip  - meloxicam (MOBIC) 7.5 MG tablet; Take 1 tablet (7.5 mg total) by mouth daily.  Dispense: 30 tablet; Refill: 3  - Ambulatory referral to Physical Therapy  Patient have been counseled extensively about nutrition and exercise  Return in about 6 months (around 03/14/2016) for Follow up Pain and comorbidities.  The patient was given clear instructions to go to ER or return to medical center if symptoms don't improve, worsen or new problems develop. The patient verbalized understanding. The patient was told to call to get lab results if they haven't heard anything in the next week.   This note has been created with Education officer, environmental. Any transcriptional errors are unintentional.    Jeanann Lewandowsky, MD, MHA, FACP, FAAP, CPE [facility] [place], [place] [phone]   09/15/2015, 3:10 PM

## 2015-10-10 MED FILL — MELOXICAM 7.5 MG TABLET: 7.5 | 30 days supply | Qty: 30 | Fill #0

## 2015-10-10 MED FILL — SIMVASTATIN 40 MG TABLET: 40 | 30 days supply | Qty: 30 | Fill #0

## 2015-12-06 ENCOUNTER — Encounter (HOSPITAL_COMMUNITY): Payer: Self-pay | Admitting: *Deleted

## 2015-12-06 ENCOUNTER — Ambulatory Visit (INDEPENDENT_AMBULATORY_CARE_PROVIDER_SITE_OTHER): Payer: Self-pay

## 2015-12-06 ENCOUNTER — Ambulatory Visit (HOSPITAL_COMMUNITY)
Admission: EM | Admit: 2015-12-06 | Discharge: 2015-12-06 | Disposition: A | Discharge status: Home / Self Care | Payer: Self-pay | Attending: Family Medicine | Admitting: Family Medicine

## 2015-12-06 DIAGNOSIS — J301 Allergic rhinitis due to pollen: Secondary | ICD-10-CM

## 2015-12-06 MED ORDER — TRIAMCINOLONE ACETONIDE 40 MG/ML IJ SUSP
40.0000 mg | Freq: Once | INTRAMUSCULAR | Status: AC
  Administered 2015-12-06: 40 mg via INTRAMUSCULAR

## 2015-12-06 MED ORDER — METHYLPREDNISOLONE ACETATE 40 MG/ML IJ SUSP
80.0000 mg | Freq: Once | INTRAMUSCULAR | Status: AC
  Administered 2015-12-06: 80 mg via INTRAMUSCULAR

## 2015-12-06 MED ORDER — CETIRIZINE HCL 10 MG PO TABS: 10.0000 mg | ORAL_TABLET | Freq: Every day | ORAL | Status: AC

## 2015-12-06 MED ORDER — SUMATRIPTAN SUCCINATE 50 MG PO TABS: 50.0000 mg | ORAL_TABLET | ORAL | Status: DC | PRN

## 2015-12-06 MED ORDER — FLUTICASONE PROPIONATE 50 MCG/ACT NA SUSP: 1.0000 | Freq: Two times a day (BID) | NASAL | Status: DC

## 2015-12-06 MED ORDER — TRIAMCINOLONE ACETONIDE 40 MG/ML IJ SUSP
INTRAMUSCULAR | Status: AC
  Filled 2015-12-06: qty 1

## 2015-12-06 MED ORDER — METHYLPREDNISOLONE ACETATE 80 MG/ML IJ SUSP
INTRAMUSCULAR | Status: AC
  Filled 2015-12-06: qty 1

## 2015-12-06 NOTE — Discharge Instructions (Signed)
Use medicine as prescribed, no infection on x-ray.

## 2015-12-06 NOTE — ED Provider Notes (Signed)
CSN: [id number]     Arrival date & time 12/06/15  1402 History   First MD Initiated Contact with Patient 12/06/15 1633     Chief Complaint  Patient presents with  . Headache   (Consider location/radiation/quality/duration/timing/severity/associated sxs/prior Treatment) Patient is a 60 y.o. female presenting with URI. The history is provided by the patient.  URI Presenting symptoms: congestion and facial pain   Severity:  Mild Onset quality:  Gradual Duration:  1 month Progression:  Unchanged Chronicity:  New Ineffective treatments: [place] z[place] without improvement. Associated symptoms: sinus pain     Past Medical History  Diagnosis Date  . Hyperlipidemia   . LPRD (laryngopharyngeal reflux disease)     [facility] by WUJ[facility]  . Allergic rhinitis    Past Surgical History  Procedure Laterality Date  . Breast lumpectomy      benign  . Colonoscopy N/A 07/12/2014    SLF:  1. small ulcer in teh terminal ileum 2. Small internal hemorrhoids.  . Tonsillectomy     Family History  Problem Relation Age of Onset  . Colon cancer Neg Hx    Social History  Substance Use Topics  . Smoking status: Never Smoker   . Smokeless tobacco: None  . Alcohol Use: No   OB History    No data available     Review of Systems  Constitutional: Negative.   HENT: Positive for congestion and postnasal drip. Negative for facial swelling.   Respiratory: Negative.   Cardiovascular: Negative.   All other systems reviewed and are negative.   Allergies  Aspirin  Home Medications   Prior to Admission medications   Medication Sig Start Date End Date Taking? Authorizing Provider  acetaminophen (TYLENOL) 500 MG tablet Take 2 tablets (1,000 mg total) by mouth every 8 (eight) hours as needed. Take 2 tabs every 8 hours. 05/06/15   Ofilia NeasMichael L Clark, PA-C  cetirizine (ZYRTEC) 10 MG tablet Take 1 tablet (10 mg total) by mouth daily. One tab daily for allergies 12/06/15    Linna HoffJames D TRUE Garciamartinez, MD  fluticasone [facility](FLONASE) 50 MCG/ACT nasal spray Place 1 spray into both nostrils 2 (two) times daily. 12/06/15   Linna HoffJames D Shamikia Linskey, MD  hydrocortisone (ANUSOL-HC) 2.5 % rectal cream Place 1 application rectally 2 (two) times daily. 05/20/15   Jaclyn ShaggyEnobong Amao, MD  hydrocortisone-pramoxine (ANALPRAM HC) 2.5-1 % rectal cream Place 1 application rectally 3 (three) times daily. 05/20/15   Jaclyn ShaggyEnobong Amao, MD  meloxicam (MOBIC) 7.5 MG tablet Take 1 tablet (7.5 mg total) by mouth daily. 09/15/15   Quentin Angstlugbemiga E Jegede, MD  pantoprazole (PROTONIX) 40 MG tablet Take 1 tablet (40 mg total) by mouth daily. 05/20/15   Jaclyn ShaggyEnobong Amao, MD  Ranitidine HCl (ZANTAC PO) Take 1 tablet by mouth daily as needed (heartburn states she takes only as needed).     Historical Provider, MD  simvastatin (ZOCOR) 40 MG tablet Take 1 tablet (40 mg total) by mouth at bedtime. 09/15/15   Quentin Angstlugbemiga E Jegede, MD   Meds Ordered and Administered this Visit   Medications  triamcinolone acetonide (KENALOG-40) injection 40 mg (not administered)  methylPREDNISolone acetate (DEPO-MEDROL) injection 80 mg (not administered)    BP 129/69 mmHg  Pulse 70  Temp(Src) 98.3 F (36.8 C) (Oral)  Resp 12  SpO2 100% No data found.   Physical Exam  Constitutional: She is oriented to person, place, and time. She appears well-developed and well-nourished.  HENT:  Head: Normocephalic.  Right Ear: External ear normal.  Left Ear: External ear normal.  Nose: Nose normal.  Mouth/Throat: Oropharynx is clear and moist.  Eyes: Conjunctivae are normal. Pupils are equal, round, and reactive to light.  Neck: Normal range of motion. Neck supple.  Cardiovascular: Normal rate and regular rhythm.   Pulmonary/Chest: Effort normal and breath sounds normal.  Lymphadenopathy:    She has no cervical adenopathy.  Neurological: She is alert and oriented to person, place, and time.  Skin: Skin is warm and dry.  Nursing note and vitals reviewed.   ED  Course  Procedures (including critical care time)  Labs Review Labs Reviewed - No data to display  Imaging Review Dg Sinuses Complete  12/06/2015  CLINICAL DATA:  Sinus pain and pressure for 3 months EXAM: PARANASAL SINUSES - COMPLETE 3 + VIEW COMPARISON:  None. FINDINGS: Merleen Nicely, submentovertex, and lateral views were obtained. 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 Aerated paranasal sinuses are clear. There is no air-fluid level. No bony destruction or expansion. Mastoids appear clear bilaterally. Nasal septum is slightly deviated toward the left. IMPRESSION: Aerated paranasal sinuses are clear. 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 There is slight leftward deviation of the nasal septum. No bony lesions identified. Electronically Signed   By: Bretta Bang III M.D.   On: 12/06/2015 16:56   X-rays reviewed and report per radiologist.   Visual Acuity Review  Right Eye Distance:   Left Eye Distance:   Bilateral Distance:    Right Eye Near:   Left Eye Near:    Bilateral Near:         MDM   1. Seasonal allergic rhinitis due to pollen        Linna Hoff, MD 12/06/15 1721

## 2015-12-06 NOTE — ED Notes (Addendum)
Headache    And  Sinus  Pressure   With      Congestion  Seen  sev  Months   Given  z  Pack  Pt  Still  Having  Symptoms

## 2015-12-07 MED FILL — ?CETIRIZINE HCL 10 MG TABLE: 10 | 30 days supply | Qty: 30 | Fill #0

## 2015-12-07 MED FILL — SIMVASTATIN 40 MG TABLET: 40 | 30 days supply | Qty: 30 | Fill #1

## 2015-12-07 MED FILL — FLUTICASONE PROP 50 MCG SPR: 50 | 27 days supply | Qty: 16 | Fill #0

## 2015-12-14 ENCOUNTER — Ambulatory Visit: Payer: Self-pay | Attending: Internal Medicine | Admitting: Internal Medicine

## 2015-12-14 ENCOUNTER — Encounter: Payer: Self-pay | Admitting: Internal Medicine

## 2015-12-14 VITALS — BP 117/76 | HR 62 | Temp 98.0°F | Wt 106.0 lb

## 2015-12-14 DIAGNOSIS — J302 Other seasonal allergic rhinitis: Secondary | ICD-10-CM

## 2015-12-14 DIAGNOSIS — J3089 Other allergic rhinitis: Secondary | ICD-10-CM | POA: Insufficient documentation

## 2015-12-14 DIAGNOSIS — G43009 Migraine without aura, not intractable, without status migrainosus: Secondary | ICD-10-CM | POA: Insufficient documentation

## 2015-12-14 DIAGNOSIS — N959 Unspecified menopausal and perimenopausal disorder: Secondary | ICD-10-CM

## 2015-12-14 DIAGNOSIS — N951 Menopausal and female climacteric states: Secondary | ICD-10-CM

## 2015-12-14 MED ORDER — NORTRIPTYLINE HCL 25 MG PO CAPS: 25.0000 mg | ORAL_CAPSULE | Freq: Every day | ORAL | Status: DC

## 2015-12-14 MED ORDER — SUMATRIPTAN SUCCINATE 50 MG PO TABS: 50.0000 mg | ORAL_TABLET | ORAL | Status: DC | PRN

## 2015-12-14 MED FILL — NORTRIPTYLINE HCL 25 MG CAP: 25 | 30 days supply | Qty: 30 | Fill #0

## 2015-12-14 MED FILL — SUMATRIPTAN SUCC 50 MG TAB: 50 | 30 days supply | Qty: 9 | Fill #0

## 2015-12-14 NOTE — Progress Notes (Signed)
Subjective:    Patient ID: Judith Sherman, female    DOB: [birth year], 60 y.o.   MRN: [id number]  HPI  (Hx and exam with assist of [employer] video translator) Patient here for followup HA - seen in UC and dx with allergic rhinitis as cause Taking certrazine and feels improved Requests refill on prior HA meds (pamelor and Imitrex) Requests refer for gyn exam  Past Medical History  Diagnosis Date  . Hyperlipidemia   . LPRD (laryngopharyngeal reflux disease)     [facility]uated by XLK:[facility]ENT[facility]  . Allergic rhinitis     Review of Systems  Constitutional: Positive for fatigue. Negative for unexpected weight change.  Eyes: Negative for visual disturbance (since HA resolved).  Respiratory: Negative for cough and shortness of breath.   Cardiovascular: Negative for chest pain and leg swelling.  Neurological: Negative for dizziness and facial asymmetry.       Objective:    Physical Exam  Constitutional: She is oriented to person, place, and time. She appears well-developed and well-nourished. No distress.  Cardiovascular: Normal rate, regular rhythm and normal heart sounds.   No murmur heard. Pulmonary/Chest: Effort normal and breath sounds normal. No respiratory distress.  Musculoskeletal: She exhibits no edema.  Neurological: She is alert and oriented to person, place, and time. She has normal reflexes. No cranial nerve deficit. Coordination normal.    BP 117/76 mmHg  Pulse 62  Temp(Src) 98 F (36.7 C) (Oral)  Wt 106 lb (48.081 kg) Wt Readings from Last 3 Encounters:  12/14/15 106 lb (48.081 kg)  09/15/15 103 lb 3.2 oz (46.811 kg)  05/20/15 101 lb (45.813 kg)     Lab Results  Component Value Date   WBC 5.4 01/12/2014   HGB 13.5 01/12/2014   HCT 39.3 01/12/2014   PLT 328 01/12/2014   GLUCOSE 85 05/30/2015   CHOL 166 05/30/2015   TRIG 72 05/30/2015   HDL 62 05/30/2015   LDLCALC 90 05/30/2015   ALT 17 05/30/2015   AST 21 05/30/2015   NA 142  05/30/2015   K 5.0 05/30/2015   CL 103 05/30/2015   CREATININE 0.55 05/30/2015   BUN 13 05/30/2015   CO2 29 05/30/2015   HGBA1C 5.5 01/12/2014    Dg Sinuses Complete  12/06/2015  CLINICAL DATA:  Sinus pain and pressure for 3 months EXAM: PARANASAL SINUSES - COMPLETE 3 + VIEW COMPARISON:  None. FINDINGS: Merleen NicelyCaldwell, [place], submentovertex, and lateral views were obtained. 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 Aerated paranasal sinuses are clear. There is no air-fluid level. No bony destruction or expansion. Mastoids appear clear bilaterally. Nasal septum is slightly deviated toward the left. IMPRESSION: Aerated paranasal sinuses are clear. 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 There is slight leftward deviation of the nasal septum. No bony lesions identified. Electronically Signed   By: Bretta BangWilliam  Woodruff III M.D.   On: 12/06/2015 16:56       Assessment & Plan:   Problem List Items Addressed This Visit    Migraine without aura and responsive to treatment - Primary    Pt previously on daily preventative pamelor and Imitrex prn with good control Recent flare during allergy season and exac by TV - now resolved, no HA or visual symptoms Requests resuming these meds - refills done for both today      Relevant Medications   SUMAtriptan (IMITREX) 50 MG tablet   nortriptyline (PAMELOR) 25 MG capsule   Other seasonal allergic rhinitis    Ok for certraine qd prn  Other Visit Diagnoses    Postmenopausal disorder        Relevant Orders    Ambulatory referral to Obstetrics / Gynecology        Rene Paci, MD

## 2015-12-14 NOTE — Assessment & Plan Note (Signed)
Ok for certraine qd prn

## 2015-12-14 NOTE — Patient Instructions (Signed)
It was good to see you today.  We have reviewed your prior records including labs and tests today  Medications reviewed and updated, no changes recommended at this time. Refill on medication(s) as discussed today.  we'll make referral to gynecology (female provider) . Our office will contact you regarding appointment(s) once made.  Please schedule followup in 3-4 months, call sooner if problems.

## 2015-12-14 NOTE — Assessment & Plan Note (Signed)
Pt previously on daily preventative pamelor and Imitrex prn with good control Recent flare during allergy season and exac by TV - now resolved, no HA or visual symptoms Requests resuming these meds - refills done for both today

## 2015-12-24 ENCOUNTER — Emergency Department (HOSPITAL_COMMUNITY)
Admission: EM | Admit: 2015-12-24 | Discharge: 2015-12-24 | Disposition: A | Discharge status: Home / Self Care | Payer: Self-pay | Attending: Emergency Medicine | Admitting: Emergency Medicine

## 2015-12-24 ENCOUNTER — Emergency Department (HOSPITAL_COMMUNITY): Payer: Self-pay

## 2015-12-24 ENCOUNTER — Encounter (HOSPITAL_COMMUNITY): Payer: Self-pay

## 2015-12-24 DIAGNOSIS — K0381 Cracked tooth: Secondary | ICD-10-CM | POA: Insufficient documentation

## 2015-12-24 DIAGNOSIS — R519 Headache, unspecified: Secondary | ICD-10-CM

## 2015-12-24 DIAGNOSIS — Z791 Long term (current) use of non-steroidal anti-inflammatories (NSAID): Secondary | ICD-10-CM | POA: Insufficient documentation

## 2015-12-24 DIAGNOSIS — E785 Hyperlipidemia, unspecified: Secondary | ICD-10-CM | POA: Insufficient documentation

## 2015-12-24 DIAGNOSIS — R51 Headache: Secondary | ICD-10-CM | POA: Insufficient documentation

## 2015-12-24 DIAGNOSIS — Z79899 Other long term (current) drug therapy: Secondary | ICD-10-CM | POA: Insufficient documentation

## 2015-12-24 LAB — I-STAT CHEM 8, ED
BUN: 6 mg/dL (ref 6–20)
Calcium, Ion: 1.21 mmol/L (ref 1.13–1.30)
Chloride: 101 mmol/L (ref 101–111)
Creatinine, Ser: 0.6 mg/dL (ref 0.44–1.00)
Glucose, Bld: 90 mg/dL (ref 65–99)
HEMATOCRIT: 48 % — AB (ref 36.0–46.0)
HEMOGLOBIN: 16.3 g/dL — AB (ref 12.0–15.0)
Potassium: 4.5 mmol/L (ref 3.5–5.1)
Sodium: 141 mmol/L (ref 135–145)
TCO2: 29 mmol/L (ref 0–100)

## 2015-12-24 MED ORDER — SODIUM CHLORIDE 0.9 % IV BOLUS (SEPSIS)
1000.0000 mL | Freq: Once | INTRAVENOUS | Status: AC
  Administered 2015-12-24: 1000 mL via INTRAVENOUS

## 2015-12-24 MED ORDER — PROCHLORPERAZINE MALEATE 10 MG PO TABS: 10.0000 mg | ORAL_TABLET | Freq: Two times a day (BID) | ORAL | Status: DC | PRN

## 2015-12-24 MED ORDER — PROCHLORPERAZINE EDISYLATE 5 MG/ML IJ SOLN
10.0000 mg | Freq: Once | INTRAMUSCULAR | Status: AC
  Administered 2015-12-24: 10 mg via INTRAVENOUS
  Filled 2015-12-24: qty 2

## 2015-12-24 NOTE — ED Provider Notes (Signed)
CSN: [id number]     Arrival date & time 12/24/15  4098 History   First MD Initiated Contact with Patient 12/24/15 [phone]    Level V caveat secondary to language barrier history obtained through interpreter Chief Complaint  Patient presents with  . Headache  Level V caveat secondary to language barrier HPI  60 year old female who speaks been the knees, presents today with a headache that has been present for approximately 2 months. She states that she has had a migraine headache in the past that was worked up in [place] with an MRI. She states that that time she was told it was a migraine headache. They essentially resolved for several years but similar headache started again 2 months ago. She has been seen by her primary care doctor and started on Imitrex by mouth. She has been taking that sometimes with relief with the headaches do come back. She has not noted any vision changes, lateralized weakness, fever, or neck stiffness. He describes the headache as occurring most days. She states that she has done yoga and relaxation which has helped. He states that the last time it was gone for any significant period of time as she was at the beach 2 weeks ago. She describes the headache as on more on the right side but also occurring sometimes on the left. She also states that she does have a tooth ache. She has not had any ear pain or neck swelling. Past Medical History  Diagnosis Date  . Hyperlipidemia   . LPRD (laryngopharyngeal reflux disease)     Evaluated by YNW:[facility]  . Allergic rhinitis    Past Surgical History  Procedure Laterality Date  . Breast lumpectomy      benign  . Colonoscopy N/A 07/12/2014    SLF:  1. small ulcer in teh terminal ileum 2. Small internal hemorrhoids.  . Tonsillectomy     Family History  Problem Relation Age of Onset  . Colon cancer Neg Hx    Social History  Substance Use Topics  . Smoking status: Never Smoker   . Smokeless tobacco:  None  . Alcohol Use: No   OB History    No data available     Review of Systems  All other systems reviewed and are negative.     Allergies  Aspirin  Home Medications   Prior to Admission medications   Medication Sig Start Date End Date Taking? Authorizing Provider  acetaminophen (TYLENOL) 500 MG tablet Take 2 tablets (1,000 mg total) by mouth every 8 (eight) hours as needed. Take 2 tabs every 8 hours. 05/06/15  Yes Ofilia Neas, PA-C  cetirizine (ZYRTEC) 10 MG tablet Take 1 tablet (10 mg total) by mouth daily. One tab daily for allergies 12/06/15  Yes Linna Hoff, MD  fluticasone [facility]) 50 MCG/ACT nasal spray Place 1 spray into both nostrils daily.  12/07/15  Yes Historical Provider, MD  meloxicam (MOBIC) 7.5 MG tablet Take 7.5 mg by mouth daily. 10/10/15  Yes Historical Provider, MD  nortriptyline (PAMELOR) 25 MG capsule Take 1 capsule (25 mg total) by mouth at bedtime. 12/14/15  Yes Newt Lukes, MD  simvastatin (ZOCOR) 40 MG tablet Take 1 tablet (40 mg total) by mouth at bedtime. 09/15/15  Yes Quentin Angst, MD  SUMAtriptan (IMITREX) 50 MG tablet Take 1 tablet (50 mg total) by mouth every 2 (two) hours as needed for migraine. May repeat in 2 hours if headache persists or recurs. 12/14/15  Yes  Newt LukesValerie A Leschber, MD   BP 157/93 mmHg  Pulse 78  Temp(Src) 98.1 F (36.7 C) (Oral)  Resp 18  SpO2 100% Physical Exam  Constitutional: She is oriented to person, place, and time. She appears well-developed and well-nourished.  HENT:  Head: Normocephalic and atraumatic.  Right Ear: External ear normal.  Left Ear: External ear normal.  Nose: Nose normal.  Mouth/Throat: Oropharynx is clear and moist.    Broken tooth  Eyes: Conjunctivae and EOM are normal. Pupils are equal, round, and reactive to light.  Neck: Normal range of motion. Neck supple.  Cardiovascular: Normal rate, regular rhythm, normal heart sounds and intact distal pulses.   Pulmonary/Chest: Effort  normal and breath sounds normal.  Abdominal: Soft. Bowel sounds are normal.  Musculoskeletal: Normal range of motion.  Neurological: She is alert and oriented to person, place, and time. She has normal strength. No cranial nerve deficit or sensory deficit. She displays a negative Romberg sign.  Reflex Scores:      Brachioradialis reflexes are 1+ on the right side and 1+ on the left side.      Patellar reflexes are 2+ on the right side and 2+ on the left side. Skin: Skin is warm and dry.  Psychiatric: She has a normal mood and affect. Her behavior is normal. Judgment and thought content normal.  Nursing note and vitals reviewed.   ED Course  Procedures (including critical care time) Labs Review Labs Reviewed  I-STAT CHEM 8, ED - Abnormal; Notable for the following:    Hemoglobin 16.3 (*)    HCT 48.0 (*)    All other components within normal limits    Imaging Review Ct Head Wo Contrast  12/24/2015  CLINICAL DATA:  Acute intermittent headaches with dizziness EXAM: CT HEAD WITHOUT CONTRAST TECHNIQUE: Contiguous axial images were obtained from the base of the skull through the vertex without intravenous contrast. COMPARISON:  None. FINDINGS: Brain: No acute intracranial hemorrhage, mass lesion, infarction, midline shift, herniation, hydrocephalus, or extra-axial fluid collection. No focal mass effect or edema. Normal gray-white matter differentiation. Symmetric dense bilateral basal ganglia calcifications. Cisterns are patent. No cerebellar abnormality. Vascular: No hyperdense vessel or unexpected calcification. Skull: Negative for fracture or focal lesion. Sinuses/Orbits: No acute findings. IMPRESSION: No acute intracranial abnormality. Electronically Signed   By: Judie PetitM.  Shick M.D.   On: 12/24/2015 09:04   I have personally reviewed and evaluated these images and lab results as part of my medical decision-making.   EKG Interpretation None      MDM   Final diagnoses:  Headache    60 year old female history of migraines with worsening headaches over the past 2 months. Headache here resolved with 10 mg of Compazine. CT of head obtained and shows no acute abnormalities. Plan referral to neurology. Discussed plan with patient and husband and they voice understanding and agreement.     Margarita Grizzleanielle Anivea Velasques, MD 12/24/15 [phone]

## 2015-12-24 NOTE — Discharge Instructions (Signed)
?au n?a ??u. (Migraine Headache) ?au n?a ??u l m?t c?n ?au nhi v nhi?u ? m?t bn ??u c?a qu v?. M?t c?n ?au n?a ??u c th? ko di t? 30 pht ??n vi ti?ng. NGUYN NHN.  Nguyn nhn chnh xc c?a ?au n?a ??u khng ph?i lc no c?ng xc ??nh ???c. Tuy nhin, ?au n?a ??u c th? pht sinh khi cc dy th?n kinh trong no b? kch thch v gi?i phng ra cc ha ch?t gy vim. Hi?n t??ng ny gy ra ?au. M?t s? v?n ?? khc c?ng c th? gy ra ?au n?a ??u, ch?ng h?n:  R??u.  Ht thu?c l.  C?ng th?ng.  Kinh nguy?t.  Pho mt ?? lu.  Th?c ?n ho?c ?? u?ng c ch?a nitrat, glutamate, aspartame, ho?c tyramine.  Thi?u ng?.  S c la.  Caffeine.  ?i.  G?ng s?c.  M?t m?i.  Thu?c dng ?? ?i?u tr? ?au ng?c (nitroglycerine), vin thu?c trnh [place], estrogen, v m?t s? thu?c ?i?u tr? huy?t p. D?U HI?U V TRI?U CH?NG  ?au ? m?t bn ho?c c? hai bn ??u.  ?au t?ng c?n ho?c ?au nhi.  ?au nhi?u lm c?n tr? cc ho?t ??ng hng ngy.  C?n ?au k?ch pht khi c b?t k? ho?t ??ng th? ch?t no.  Bu?n nn, nn m?a, ho?c c? hai.  Chng m?t.  ?au khi ti?p xc v?i nh sng chi, ti?ng ?n l?n, ho?c ho?t ??ng.  Nh?y c?m ton thn v?i nh sng chi, ti?ng ?n l?n, ho?c mi. Tr??c khi qu v? b? ?au n?a ??u, qu v? c th? c nh?ng d?u hi?u c?nh bo s?p c c?n ?au n?a ??u (ti?n tri?u). M?t ti?n tri?u c th? bao g?m:  Nhn th?y nh sng lo ln.  Nhn th?y nh?ng ?i?m sng, qu?ng sng, ho?c cc ???ng ngo?n ngoo.  C th? tr??ng hnh ?ng ho?c nhn m?.  C c?m gic t b ho?c ?au bu?t.  Ni kh.  B? y?u c?. CH?N ?ON  ?au n?a ??u th??ng ???c ch?n ?on d?a vo:  Cc tri?u ch?ng.  Khm th?c th?.  Ch?p CT ho?c MRI ??u qu v?. Cc ki?m tra b?ng hnh ?nh ny khng th? ch?n ?on ???c ?au n?a ??u, nh?ng chng c th? gip lo?i tr? nh?ng nguyn nhn gy ?au ??u khc. ?I?U TR? C th? cho dng thu?c gi?m ?au v ch?ng bu?n nn. C?ng c th? cho dng thu?c ?? ng?n ng?a ti di?n ?au n?a ??u.  H??NG D?N  CH?M [place] T?I NH  Ch? s? d?ng thu?c khng c?n k ??n ho?c thu?c c?n k ??n ?? gi?m ?au ho?c gi?m c?m gic kh ch?u theo ch? d?n c?a chuyn gia ch?m [place] s?c kh?e c?a qu v?. Khng nn dng thu?c m gy nghi?n ko di.  N?m trong m?t phng t?i, yn t?nh khi qu v? b? ?au n?a ??u.  Ghi nh?t k hng ngy ?? tm ra ?i?u g c th? gy cc c?n ?au n?a ??u. Ch?ng h?n, hy ghi ra:  Qu v? ?n v u?ng g.  Qu v? ? ng? bao lu.  B?t k? thay ??i no trong ch? ?? ?n ho?c thu?c men.  H?n ch? s? d?ng r??u.  B? thu?c l, n?u qu v? ht thu?c.  Ng? 7 - 9 ti?ng, ho?c theo khuy?n ngh? c?a chuyn gia ch?m [place] s?c kh?e.  H?n ch? c?ng th?ng.  Gi? cho nh sng d?u nh? n?u nh sng m?nh lm qu v? kh ch?u v lm ch?ng ?au n?a ??  u t?i t? h?n. °NGAY L?P T?C ?I KHÁM N?U:  °· C?n ?au n?a ??u c?a quý v? n?ng h?n. °· Quý v? b? s?t. °· Quý v? b? c?ng c?. °· Quý v? b? m?t th? l?c. °· Quý v? b? y?u c? ho?c m?t ki?m soát c?. °· Quý v? b?t ??u m?t th?ng b?ng ho?c ?i l?i khó kh?n. °· Quý v? c?m th?y mu?n ng?t ho?c ng?t. °· Quý v? có nh?ng tri?u ch?ng n?ng khác v?i nh?ng tri?u ch?ng ban ??u. °??M B?O QUÝ V?:  °· Hi?u rõ các h??ng d?n này. °· S? theo dõi tình tr?ng c?a mình. °· S? yêu c?u tr? giúp ngay l?p t?c n?u quý v? c?m th?y không kh?e ho?c th?y tr?m tr?ng h?n. °  °Thông tin này không nh?m m?c ?ích thay th? cho l?i khuyên mà chuyên gia ch?m sóc s?c kh?e nói v?i quý v?. Hãy b?o ??m quý v? ph?i th?o lu?n b?t k? v?n ?? gì mà quý v? có v?i chuyên gia ch?m sóc s?c kh?e c?a quý v?. °  °Document Released: 07/09/2005 Document Revised: 04/29/2013 °Elsevier Interactive Patient Education ©2016 Elsevier Inc. ° °

## 2015-12-24 NOTE — ED Notes (Signed)
Patient here with intermittent headache x 3 months. Describes the pain as so severe last pm unable to sleep. Denies trauma. Taking sumatriptan with minimal relief

## 2015-12-26 ENCOUNTER — Ambulatory Visit: Payer: No Typology Code available for payment source | Admitting: Internal Medicine

## 2016-01-17 ENCOUNTER — Ambulatory Visit: Payer: Self-pay | Attending: Internal Medicine | Admitting: Internal Medicine

## 2016-01-17 ENCOUNTER — Encounter: Payer: Self-pay | Admitting: Internal Medicine

## 2016-01-17 VITALS — BP 115/76 | HR 78 | Temp 97.7°F | Wt 105.4 lb

## 2016-01-17 DIAGNOSIS — Z0000 Encounter for general adult medical examination without abnormal findings: Secondary | ICD-10-CM

## 2016-01-17 DIAGNOSIS — G43801 Other migraine, not intractable, with status migrainosus: Secondary | ICD-10-CM

## 2016-01-17 MED ORDER — SUMATRIPTAN SUCCINATE 50 MG PO TABS: 50.0000 mg | ORAL_TABLET | ORAL | Status: DC | PRN

## 2016-01-17 MED ORDER — NORTRIPTYLINE HCL 25 MG PO CAPS: 25.0000 mg | ORAL_CAPSULE | Freq: Every day | ORAL | Status: DC

## 2016-01-17 MED FILL — SIMVASTATIN 40 MG TABLET: 40 | 30 days supply | Qty: 30 | Fill #2

## 2016-01-17 NOTE — Patient Instructions (Signed)
- Lab appt this week for lab draw /fasting -    ?au n?a ??u. (Migraine Headache) ?au n?a ??u l m?t c?n ?au nhi v nhi?u ? m?t bn ??u c?a qu v?. M?t c?n ?au n?a ??u c th? ko di t? 30 pht ??n vi ti?ng. NGUYN NHN.  Nguyn nhn chnh xc c?a ?au n?a ??u khng ph?i lc no c?ng xc ??nh ???c. Tuy nhin, ?au n?a ??u c th? pht sinh khi cc dy th?n kinh trong no b? kch thch v gi?i phng ra cc ha ch?t gy vim. Hi?n t??ng ny gy ra ?au. M?t s? v?n ?? khc c?ng c th? gy ra ?au n?a ??u, ch?ng h?n:  R??u.  Ht thu?c l.  C?ng th?ng.  Kinh nguy?t.  Pho mt ?? lu.  Th?c ?n ho?c ?? u?ng c ch?a nitrat, glutamate, aspartame, ho?c tyramine.  Thi?u ng?.  S c la.  Caffeine.  ?i.  G?ng s?c.  M?t m?i.  Thu?c dng ?? ?i?u tr? ?au ng?c (nitroglycerine), vin thu?c trnh [place], estrogen, v m?t s? thu?c ?i?u tr? huy?t p. D?U HI?U V TRI?U CH?NG  ?au ? m?t bn ho?c c? hai bn ??u.  ?au t?ng c?n ho?c ?au nhi.  ?au nhi?u lm c?n tr? cc ho?t ??ng hng ngy.  C?n ?au k?ch pht khi c b?t k? ho?t ??ng th? ch?t no.  Bu?n nn, nn m?a, ho?c c? hai.  Chng m?t.  ?au khi ti?p xc v?i nh sng chi, ti?ng ?n l?n, ho?c ho?t ??ng.  Nh?y c?m ton thn v?i nh sng chi, ti?ng ?n l?n, ho?c mi. Tr??c khi qu v? b? ?au n?a ??u, qu v? c th? c nh?ng d?u hi?u c?nh bo s?p c c?n ?au n?a ??u (ti?n tri?u). M?t ti?n tri?u c th? bao g?m:  Nhn th?y nh sng lo ln.  Nhn th?y nh?ng ?i?m sng, qu?ng sng, ho?c cc ???ng ngo?n ngoo.  C th? tr??ng hnh ?ng ho?c nhn m?.  C c?m gic t b ho?c ?au bu?t.  Ni kh.  B? y?u c?. CH?N ?ON  ?au n?a ??u th??ng ???c ch?n ?on d?a vo:  Cc tri?u ch?ng.  Khm th?c th?.  Ch?p CT ho?c MRI ??u qu v?. Cc ki?m tra b?ng hnh ?nh ny khng th? ch?n ?on ???c ?au n?a ??u, nh?ng chng c th? gip lo?i tr? nh?ng nguyn nhn gy ?au ??u khc. ?I?U TR? C th? cho dng thu?c gi?m ?au v ch?ng bu?n nn. C?ng c th? cho dng  thu?c ?? ng?n ng?a ti di?n ?au n?a ??u.  H??NG D?N CH?M [place] T?I NH  Ch? s? d?ng thu?c khng c?n k ??n ho?c thu?c c?n k ??n ?? gi?m ?au ho?c gi?m c?m gic kh ch?u theo ch? d?n c?a chuyn gia ch?m [place] s?c kh?e c?a qu v?. Khng nn dng thu?c m gy nghi?n ko di.  N?m trong m?t phng t?i, yn t?nh khi qu v? b? ?au n?a ??u.  Ghi nh?t k hng ngy ?? tm ra ?i?u g c th? gy cc c?n ?au n?a ??u. Ch?ng h?n, hy ghi ra:  Qu v? ?n v u?ng g.  Qu v? ? ng? bao lu.  B?t k? thay ??i no trong ch? ?? ?n ho?c thu?c men.  H?n ch? s? d?ng r??u.  B? thu?c l, n?u qu v? ht thu?c.  Ng? 7 - 9 ti?ng, ho?c theo khuy?n ngh? c?a chuyn gia ch?m [place] s?c kh?e.  H?n ch? c?ng th?ng.  Gi? cho nh sng d?u nh? n?u  nh sng m?nh lm qu v? kh ch?u v lm ch?ng ?au n?a ??u t?i t? h?n. NGAY L?P T?C ?I KHM N?U:   C?n ?au n?a ??u c?a qu v? n?ng h?n.  Qu v? b? s?t.  Qu v? b? c?ng c?.  Qu v? b? m?t th? l?c.  Qu v? b? y?u c? ho?c m?t ki?m sot c?.  Qu v? b?t ??u m?t th?ng b?ng ho?c ?i l?i kh kh?n.  Qu v? c?m th?y mu?n ng?t ho?c ng?t.  Qu v? c nh?ng tri?u ch?ng n?ng khc v?i nh?ng tri?u ch?ng ban ??u. ??M B?O QU V?:   Hi?u r cc h??ng d?n ny.  S? theo di tnh tr?ng c?a mnh.  S? yu c?u tr? gip ngay l?p t?c n?u qu v? c?m th?y khng kh?e ho?c th?y tr?m tr?ng h?n.   Thng tin ny khng nh?m m?c ?ch thay th? cho l?i khuyn m chuyn gia ch?m [place] s?c kh?e ni v?i qu v?. Hy b?o ??m qu v? ph?i th?o lu?n b?t k? v?n ?? g m qu v? c v?i chuyn gia ch?m  s?c kh?e c?a qu v?.   Document Released: 07/09/2005 Document Revised: 04/29/2013 Elsevier Interactive Patient Education [employer]2016 Elsevier Inc.

## 2016-01-17 NOTE — Progress Notes (Signed)
Judith Sherman, is a 60 y.o. female  OZH:[id number]  GEX:[id number]  DOB - 1956-02-18  CC:  Chief Complaint  Patient presents with  . Follow-up    Ed - Migraine       HPI: Judith Sherman is a 60 y.o. Judith Sherman female here today to establish medical care, last seen in clinic  12/14/15 for migraine headaches, w/ signif pmhx of gerd and hld.  Since than, she was seen in ED on 12/24/15 for same.  She states that tragic event happened recently which brought on the migraine headaches, had c/o of parathesias in her face and jaw concurrently. Better since she last went to ED, and than went on vacation to the beach. When she was not stressing about anything the headaches went away.  Currently only gets twinges.  She did not realize had to take the nortriptyline daily for ppx migraines.  Patient has No headache currently, No chest pain, No abdominal pain - No Nausea, No new weakness tingling or numbness, No Cough - SOB.    Review of Systems: Per HPI, o/w all systems reviewed and negative.   Allergies  Allergen Reactions  . Aspirin    Past Medical History  Diagnosis Date  . Hyperlipidemia   . LPRD (laryngopharyngeal reflux disease)     Evaluated by WNU:UV[facility]Z [facility]  . Allergic rhinitis    Current Outpatient Prescriptions on File Prior to Visit  Medication Sig Dispense Refill  . acetaminophen (TYLENOL) 500 MG tablet Take 2 tablets (1,000 mg total) by mouth every 8 (eight) hours as needed. Take 2 tabs every 8 hours. 30 tablet 0  . cetirizine (ZYRTEC) 10 MG tablet Take 1 tablet (10 mg total) by mouth daily. One tab daily for allergies 30 tablet 1  . fluticasone (FLONASE) 50 MCG/ACT nasal spray Place 1 spray into both nostrils daily.   2  . meloxicam (MOBIC) 7.5 MG tablet Take 7.5 mg by mouth daily.  3  . prochlorperazine (COMPAZINE) 10 MG tablet Take 1 tablet (10 mg total) by mouth 2 (two) times daily as needed (for headache). 10 tablet 0  . simvastatin (ZOCOR) 40 MG  tablet Take 1 tablet (40 mg total) by mouth at bedtime. 90 tablet 3   No current facility-administered medications on file prior to visit.   Family History  Problem Relation Age of Onset  . Colon cancer Neg Hx    Social History   Social History  . Marital Status: Divorced    Spouse Name: N/A  . Number of Children: 0  . Years of Education: N/A   Occupational History  . unemployed    Social History Main Topics  . Smoking status: Never Smoker   . Smokeless tobacco: Not on file  . Alcohol Use: No  . Drug Use: No  . Sexual Activity: Not on file   Other Topics Concern  . Not on file   Social History Narrative    Objective:   Filed Vitals:   01/17/16 1726  BP: 115/76  Pulse: 78  Temp: 97.7 F (36.5 C)    Filed Weights   01/17/16 1726  Weight: 105 lb 6.4 oz (47.809 kg)    BP Readings from Last 3 Encounters:  01/17/16 115/76  12/24/15 145/86  12/14/15 117/76    Physical Exam: Constitutional: Patient appears well-developed and well-nourished. No distress. AAOx3, pleasant thin appearing female. HENT: Normocephalic, atraumatic, External right and left ear normal. Oropharynx is clear and moist.  Eyes: Conjunctivae and EOM are normal.  PERRL, no scleral icterus. Neck: Normal ROM. Neck supple. No JVD.  CVS: RRR, S1/S2 +, no murmurs, no gallops, no carotid bruit.  Pulmonary: Effort and breath sounds normal, no stridor, rhonchi, wheezes, rales.  Abdominal: Soft. BS +, nttp Musculoskeletal: Normal range of motion. No edema and no tenderness.  LE: bilat/ no c/c/e, pulses 2+ bilateral. Neuro: Alert.  muscle tone coordination wnl. No cranial nerve deficit grossly. Skin: Skin is warm and dry. No rash noted. Not diaphoretic. No erythema. No pallor. Psychiatric: Normal mood and affect. Behavior, judgment, thought content normal.  Lab Results  Component Value Date   WBC 5.4 01/12/2014   HGB 16.3* 12/24/2015   HCT 48.0* 12/24/2015   MCV 89.5 01/12/2014   PLT 328  01/12/2014   Lab Results  Component Value Date   CREATININE 0.60 12/24/2015   BUN 6 12/24/2015   NA 141 12/24/2015   K 4.5 12/24/2015   CL 101 12/24/2015   CO2 29 05/30/2015    Lab Results  Component Value Date   HGBA1C 5.5 01/12/2014   Lipid Panel     Component Value Date/Time   CHOL 166 05/30/2015 0935   TRIG 72 05/30/2015 0935   HDL 62 05/30/2015 0935   CHOLHDL 2.7 05/30/2015 0935   VLDL 14 05/30/2015 0935   LDLCALC 90 05/30/2015 0935       Depression screen PHQ 2/9 01/17/2016 12/14/2015 09/15/2015 05/20/2015  Decreased Interest 0 0 0 0  Down, Depressed, Hopeless 0 0 0 0  PHQ - 2 Score 0 0 0 0  Altered sleeping - 0 - -  Tired, decreased energy - 0 - -  Change in appetite - 0 - -  Feeling bad or failure about yourself  - 0 - -  Trouble concentrating - 0 - -  Moving slowly or fidgety/restless - 0 - -  Suicidal thoughts - 0 - -  PHQ-9 Score - 0 - -  Difficult doing work/chores - Not difficult at all - -    Assessment and plan:   1. Other migraine with status migrainosus, not intractable, suspect exacerbated by recent stressors, got better when went on vacation. - encouraged pt to take her nortriptyline daily,  - renewed imitrex prn, only use sparingly. - has neuro c/s pending, but states headaches better, may not need to see. - certainly, if pains recur, could try ppx Topamax as well, but seems better when she is not under stress.  2. Annual physical exam -  CBC with Differential; Future - CMP and Liver; Future - Lipid panel; Future - TSH; Future - VITAMIN D 25 Hydroxy (Vit-D Deficiency, Fractures); Future - Hepatitis C Ab Reflex HCV RNA, QUANT; Future - HIV antibody (with reflex); Future  3. Health maintenance - per pt, had prior breast bx /surgery, which was neg, but recd to get yearly MM, due 11/17 -  colonscopy 12/15 w/ internal hemorrhoids/small ulcer, recd high fiber diet, repeat 10 years - due for Pap smear - set up appt w/ me soon  4. Pt speaks  [place] - no language barrier since I am also fluent in [place].  Return in about 2 weeks (around 01/31/2016) for pap smear.  The patient was given clear instructions to go to ER or return to medical center if symptoms don't improve, worsen or new problems develop. The patient verbalized understanding. The patient was told to call to get lab results if they haven't heard anything in the next week.    This note has been created  with Education officer, environmentalDragon speech recognition software and smart phrase technology. Any transcriptional errors are unintentional.   Pete Glatterawn T Darran Gabay, MD, MBA/MHA [facility] [place], [place] [phone]   01/17/2016, 6:01 PM

## 2016-01-18 ENCOUNTER — Ambulatory Visit: Payer: Self-pay | Attending: Internal Medicine

## 2016-01-18 DIAGNOSIS — G43801 Other migraine, not intractable, with status migrainosus: Secondary | ICD-10-CM

## 2016-01-18 DIAGNOSIS — Z0000 Encounter for general adult medical examination without abnormal findings: Secondary | ICD-10-CM

## 2016-01-18 LAB — CMP AND LIVER
ALT: 28 U/L (ref 6–29)
AST: 22 U/L (ref 10–35)
Albumin: 4.6 g/dL (ref 3.6–5.1)
Alkaline Phosphatase: 55 U/L (ref 33–130)
BILIRUBIN DIRECT: 0.1 mg/dL (ref <=0.2)
BILIRUBIN INDIRECT: 0.8 mg/dL (ref 0.2–1.2)
BILIRUBIN TOTAL: 0.9 mg/dL (ref 0.2–1.2)
BUN: 13 mg/dL (ref 7–25)
CALCIUM: 9.6 mg/dL (ref 8.6–10.4)
CHLORIDE: 101 mmol/L (ref 98–110)
CO2: 27 mmol/L (ref 20–31)
CREATININE: 0.66 mg/dL (ref 0.50–0.99)
Glucose, Bld: 81 mg/dL (ref 65–99)
Potassium: 4 mmol/L (ref 3.5–5.3)
Sodium: 139 mmol/L (ref 135–146)
Total Protein: 8.1 g/dL (ref 6.1–8.1)

## 2016-01-18 LAB — CBC WITH DIFFERENTIAL/PLATELET
BASOS ABS: 0 {cells}/uL (ref 0–200)
BASOS PCT: 0 %
EOS ABS: 56 {cells}/uL (ref 15–500)
Eosinophils Relative: 1 %
HEMATOCRIT: 44.2 % (ref 35.0–45.0)
Hemoglobin: 14.9 g/dL (ref 11.7–15.5)
LYMPHS PCT: 32 %
Lymphs Abs: 1792 cells/uL (ref 850–3900)
MCH: 31.5 pg (ref 27.0–33.0)
MCHC: 33.7 g/dL (ref 32.0–36.0)
MCV: 93.4 fL (ref 80.0–100.0)
MONO ABS: 280 {cells}/uL (ref 200–950)
MONOS PCT: 5 %
MPV: 8.7 fL (ref 7.5–12.5)
NEUTROS PCT: 62 %
Neutro Abs: 3472 cells/uL (ref 1500–7800)
PLATELETS: 302 10*3/uL (ref 140–400)
RBC: 4.73 MIL/uL (ref 3.80–5.10)
RDW: 13.1 % (ref 11.0–15.0)
WBC: 5.6 10*3/uL (ref 3.8–10.8)

## 2016-01-18 LAB — TSH: TSH: 1.98 mIU/L

## 2016-01-18 LAB — LIPID PANEL
CHOLESTEROL: 248 mg/dL — AB (ref 125–200)
HDL: 108 mg/dL (ref >=46)
LDL Cholesterol: 120 mg/dL (ref <130)
TRIGLYCERIDES: 102 mg/dL (ref <150)
Total CHOL/HDL Ratio: 2.3 Ratio (ref <=5.0)
VLDL: 20 mg/dL (ref <30)

## 2016-01-18 MED FILL — SUMATRIPTAN SUCC 50 MG TAB: 50 | 30 days supply | Qty: 9 | Fill #0

## 2016-01-18 MED FILL — NORTRIPTYLINE HCL 25 MG CAP: 25 | 30 days supply | Qty: 30 | Fill #0

## 2016-01-19 LAB — VITAMIN D 25 HYDROXY (VIT D DEFICIENCY, FRACTURES): Vit D, 25-Hydroxy: 19 ng/mL — ABNORMAL LOW (ref 30–100)

## 2016-01-19 LAB — HEPATITIS C ANTIBODY: HCV AB: NEGATIVE

## 2016-01-19 LAB — HIV ANTIBODY (ROUTINE TESTING W REFLEX): HIV: NONREACTIVE

## 2016-01-20 ENCOUNTER — Other Ambulatory Visit: Payer: Self-pay

## 2016-01-25 ENCOUNTER — Telehealth: Payer: Self-pay | Admitting: Internal Medicine

## 2016-01-25 MED ORDER — VITAMIN D (ERGOCALCIFEROL) 1.25 MG (50000 UNIT) PO CAPS: 50000.0000 [IU] | ORAL_CAPSULE | ORAL | Status: DC

## 2016-01-25 MED FILL — VIT D2 1.25 MG (50,000 UNIT: 1.25 MG | 84 days supply | Qty: 12 | Fill #0

## 2016-01-25 MED FILL — SIMVASTATIN 40 MG TABLET: 40 | 90 days supply | Qty: 90 | Fill #3

## 2016-01-25 NOTE — Telephone Encounter (Signed)
Attempted to call pt w/ labs

## 2016-01-31 ENCOUNTER — Other Ambulatory Visit: Payer: Self-pay | Admitting: Internal Medicine

## 2016-02-23 ENCOUNTER — Encounter: Payer: Self-pay | Admitting: Obstetrics and Gynecology

## 2016-02-23 ENCOUNTER — Ambulatory Visit (INDEPENDENT_AMBULATORY_CARE_PROVIDER_SITE_OTHER): Payer: Self-pay | Admitting: Obstetrics and Gynecology

## 2016-02-23 VITALS — BP 147/85 | HR 77 | Wt 104.3 lb

## 2016-02-23 DIAGNOSIS — Z789 Other specified health status: Secondary | ICD-10-CM

## 2016-02-23 DIAGNOSIS — Z01419 Encounter for gynecological examination (general) (routine) without abnormal findings: Secondary | ICD-10-CM

## 2016-02-23 DIAGNOSIS — N898 Other specified noninflammatory disorders of vagina: Secondary | ICD-10-CM

## 2016-02-23 NOTE — Progress Notes (Signed)
Video interpreter "Lucille Passy" [phone] used

## 2016-02-24 ENCOUNTER — Encounter: Payer: Self-pay | Admitting: Obstetrics and Gynecology

## 2016-02-24 DIAGNOSIS — Z603 Acculturation difficulty: Secondary | ICD-10-CM | POA: Insufficient documentation

## 2016-02-24 DIAGNOSIS — N898 Other specified noninflammatory disorders of vagina: Secondary | ICD-10-CM | POA: Insufficient documentation

## 2016-02-24 DIAGNOSIS — Z789 Other specified health status: Secondary | ICD-10-CM | POA: Insufficient documentation

## 2016-02-24 LAB — CYTOLOGY - PAP

## 2016-02-24 NOTE — Progress Notes (Addendum)
Obstetrics and Gynecology Visit New Patient Evaluation  Appointment Date: 02/24/2016  OBGYN Clinic: [facility]  Primary Care Provider: Jeanann Lewandowsky  Referring Provider: Newt Lukes, MD  Chief Complaint:  Chief Complaint  Patient presents with  . Gynecologic Exam    History of Present Illness: Judith Sherman is a 60 y.o. Asian [id number]. (LMP: late 40s/early 39s), seen for an annual GYN exam. Her past medical history is significant for being post menopausal.     She states she had some PMB 5 years ago but was evaluated and was negative and no bleeding or spotting since then.   No breast s/s, fevers, chills, chest pain, SOB, nausea, vomiting, abdominal pain, dysuria, hematuria, vaginal itching or discharge, diarrhea, constipation, blood in BMs.  She does endorse some vaginal dryness with intercourse; they don't use anything for lubrication  Review of Systems: Her 12 point review of systems is negative or as noted in the History of Present Illness.  Past Medical History:  Past Medical History:  Diagnosis Date  . Allergic rhinitis   . Hyperlipidemia   . Hypertension   . LPRD (laryngopharyngeal reflux disease)    Evaluated by WUJ:WJXB [facility]    Past Surgical History:  Past Surgical History:  Procedure Laterality Date  . BREAST LUMPECTOMY     benign  . COLONOSCOPY N/A 07/12/2014   SLF:  1. small ulcer in teh terminal ileum 2. Small internal hemorrhoids.  . TONSILLECTOMY      Past Obstetrical History:  OB History  Gravida Para Term Preterm AB Living  4       4 0  SAB TAB Ectopic Multiple Live Births  4            # Outcome Date GA Lbr Len/2nd Weight Sex Delivery Anes PTL Lv  4 SAB           3 SAB           2 SAB           1 SAB             Obstetric Comments  SAB x 4    Past Gynecological History: As per HPI. ?history of abnormal pap smears in [place] (last pap smear: unsure) No. history of STIs.   History  of  HRT use: No  Social History:  Social History   Social History  . Marital status: Divorced    Spouse name: N/A  . Number of children: 0  . Years of education: N/A   Occupational History  . unemployed    Social History Main Topics  . Smoking status: Never Smoker  . Smokeless tobacco: Not on file  . Alcohol use No  . Drug use: No  . Sexual activity: Not on file   Other Topics Concern  . Not on file   Social History Narrative  . No narrative on file    Family History:  Family History  Problem Relation Age of Onset  . Colon cancer Neg Hx    She denies any female cancers, bleeding or blood clotting disorders.   Health Maintenance:  Mammogram Yes.  , which was done in 2016 and negative Colonoscopy Yes.   2015 and normal (repeat in 10 years)  Medications Ms. Eroh had no medications administered during this visit. Current Outpatient Prescriptions  Medication Sig Dispense Refill  . nortriptyline (PAMELOR) 25 MG capsule Take 1 capsule (25 mg total) by mouth at bedtime. 90 capsule 5  .  simvastatin (ZOCOR) 40 MG tablet Take 1 tablet (40 mg total) by mouth at bedtime. 90 tablet 3  . SUMAtriptan (IMITREX) 50 MG tablet Take 1 tablet (50 mg total) by mouth every 2 (two) hours as needed for migraine. May repeat in 2 hours if headache persists or recurs. 30 tablet 1  . Vitamin D, Ergocalciferol, (DRISDOL) 50000 units CAPS capsule Take 1 capsule (50,000 Units total) by mouth every 7 (seven) days. 12 capsule 0  . acetaminophen (TYLENOL) 500 MG tablet Take 2 tablets (1,000 mg total) by mouth every 8 (eight) hours as needed. Take 2 tabs every 8 hours. (Patient not taking: Reported on 02/23/2016) 30 tablet 0  . cetirizine (ZYRTEC) 10 MG tablet Take 1 tablet (10 mg total) by mouth daily. One tab daily for allergies (Patient not taking: Reported on 02/23/2016) 30 tablet 1  . fluticasone (FLONASE) 50 MCG/ACT nasal spray Place 1 spray into both nostrils daily.   2  . meloxicam (MOBIC) 7.5 MG  tablet Take 7.5 mg by mouth daily.  3  . prochlorperazine (COMPAZINE) 10 MG tablet Take 1 tablet (10 mg total) by mouth 2 (two) times daily as needed (for headache). (Patient not taking: Reported on 02/23/2016) 10 tablet 0   No current facility-administered medications for this visit.     Allergies Aspirin   Physical Exam:  BP (!) 147/85 (BP Location: Left Arm, Patient Position: Sitting, Cuff Size: Small)   Pulse 77   Wt 104 lb 4.8 oz (47.3 kg)   BMI 19.71 kg/m  Body mass index is 19.71 kg/m. General appearance: Well nourished, well developed female in no acute distress.  Neck:  Supple, normal appearance, and no thyromegaly  Cardiovascular: normal s1 and s2.  No murmurs, rubs or gallops. Respiratory:  Clear to auscultation bilateral. Normal respiratory effort Abdomen: positive bowel sounds and no masses, hernias; diffusely non tender to palpation, non distended Breasts: breasts appear normal, no suspicious masses, no skin or nipple changes or axillary nodes, and normal inspection. Neuro/Psych:  Normal mood and affect.  Skin:  Warm and dry.  Lymphatic:  No inguinal lymphadenopathy.   Pelvic exam: is not limited by body habitus EGBUS: within normal limits with moderate atrophy Vagina: within normal limits with moderate atrophy and with no blood in the vault, Cervix:  no lesions or cervical motion tenderness Uterus:  nonenlarged and approximately 6-8 week sized Adnexa:  normal adnexa and no mass, fullness, tenderness Rectovaginal: deferred  Laboratory: none  Radiology: none  Assessment: Patient doing well  Plan:  Normal well woman exam. Follow up pap and hpv testing. Pt confirms best number is [phone].  In terms of vaginal dryness, I encouraged to use silicone or water based lubricants that are OTC such as Astroglide or [place] and to let us know if these don't seem effective, as I can talk to her re: vaginal estrogen use; this aspect wasn't d/w the patient.   RTC PRN  Cornelia Copa MD Attending Center for [facility])   Interpreter used.

## 2016-03-15 ENCOUNTER — Telehealth: Payer: Self-pay | Admitting: General Practice

## 2016-03-15 NOTE — Telephone Encounter (Signed)
Per Dr Vergie LivingPickens, patient's pap was normal & she does not need another until age 60. Also need to inquire if her vaginal dryness has improved with use of lubricants. Called patient with pacific interpreter [phone], no answer- left message to call us back for results

## 2016-03-23 NOTE — Telephone Encounter (Signed)
Patient has been informed of test results.  

## 2016-05-29 ENCOUNTER — Other Ambulatory Visit: Payer: Self-pay | Admitting: Internal Medicine

## 2016-05-30 ENCOUNTER — Ambulatory Visit: Payer: Self-pay | Attending: Internal Medicine

## 2016-05-30 MED FILL — NORTRIPTYLINE HCL 25 MG CAP: 25 | 60 days supply | Qty: 60 | Fill #1

## 2016-06-07 ENCOUNTER — Ambulatory Visit: Payer: Self-pay | Attending: Internal Medicine | Admitting: Internal Medicine

## 2016-06-07 ENCOUNTER — Encounter: Payer: Self-pay | Admitting: Internal Medicine

## 2016-06-07 VITALS — BP 115/66 | HR 82 | Temp 98.2°F | Resp 18 | Ht 61.0 in | Wt 105.6 lb

## 2016-06-07 DIAGNOSIS — Z23 Encounter for immunization: Secondary | ICD-10-CM

## 2016-06-07 DIAGNOSIS — K219 Gastro-esophageal reflux disease without esophagitis: Secondary | ICD-10-CM | POA: Insufficient documentation

## 2016-06-07 DIAGNOSIS — E785 Hyperlipidemia, unspecified: Secondary | ICD-10-CM | POA: Insufficient documentation

## 2016-06-07 DIAGNOSIS — M4727 Other spondylosis with radiculopathy, lumbosacral region: Secondary | ICD-10-CM

## 2016-06-07 DIAGNOSIS — Z79899 Other long term (current) drug therapy: Secondary | ICD-10-CM | POA: Insufficient documentation

## 2016-06-07 DIAGNOSIS — K029 Dental caries, unspecified: Secondary | ICD-10-CM | POA: Insufficient documentation

## 2016-06-07 DIAGNOSIS — Z0000 Encounter for general adult medical examination without abnormal findings: Secondary | ICD-10-CM

## 2016-06-07 MED ORDER — ACETAMINOPHEN 500 MG PO TABS: 1000.0000 mg | ORAL_TABLET | Freq: Three times a day (TID) | ORAL | 0 refills | Status: DC | PRN

## 2016-06-07 MED ORDER — PENICILLIN V POTASSIUM 500 MG PO TABS: 500.0000 mg | ORAL_TABLET | Freq: Four times a day (QID) | ORAL | 0 refills | Status: AC

## 2016-06-07 MED ORDER — ACETAMINOPHEN 500 MG PO TABS: 1000.0000 mg | ORAL_TABLET | Freq: Three times a day (TID) | ORAL | 0 refills | Status: AC | PRN

## 2016-06-07 MED FILL — ?PENICILLIN VK 500 MG TABLE: 500 | 5 days supply | Qty: 20 | Fill #0

## 2016-06-07 NOTE — Progress Notes (Signed)
Judith CrockerLoan Sherman, is a 60 y.o. female  ZOX:[id number]:[id number]  WJX:[id number]  DOB - [birth year]  Chief Complaint  Patient presents with  . Dental Pain       Subjective:   Judith CrockerLoan Boulanger is a 60 y.o. female with history of GERD and hyperlipidemia here today for a follow up visit and dental referral. She is complaining of dental pains that started two weeks ago, now slightly swollen and very painful. No fever. No headache. No chest pain, No abdominal pain - No Nausea, No new weakness tingling or numbness, No Cough - SOB.  Problem  Tooth Decay  Annual Physical Exam    ALLERGIES: Allergies  Allergen Reactions  . Aspirin     PAST MEDICAL HISTORY: Past Medical History:  Diagnosis Date  . Allergic rhinitis   . Hyperlipidemia   . Hypertension   . LPRD (laryngopharyngeal reflux disease)    [facility] by [facility]    MEDICATIONS AT HOME: Prior to Admission medications   Medication Sig Start Date End Date Taking? Authorizing Provider  acetaminophen (TYLENOL) 500 MG tablet Take 2 tablets (1,000 mg total) by mouth every 8 (eight) hours as needed. Take 2 tabs every 8 hours. 06/07/16  Yes Quentin Angstlugbemiga E Juno Bozard, MD  cetirizine (ZYRTEC) 10 MG tablet Take 1 tablet (10 mg total) by mouth daily. One tab daily for allergies 12/06/15  Yes Linna HoffJames D Kindl, MD  fluticasone [facility](FLONASE) 50 MCG/ACT nasal spray Place 1 spray into both nostrils daily.  12/07/15  Yes Historical Provider, MD  meloxicam (MOBIC) 7.5 MG tablet Take 7.5 mg by mouth daily. 10/10/15  Yes Historical Provider, MD  nortriptyline (PAMELOR) 25 MG capsule Take 1 capsule (25 mg total) by mouth at bedtime. 01/17/16  Yes Pete Glatterawn T Langeland, MD  simvastatin (ZOCOR) 40 MG tablet Take 1 tablet (40 mg total) by mouth at bedtime. 09/15/15  Yes Quentin Angstlugbemiga E Geneveive Furness, MD  SUMAtriptan (IMITREX) 50 MG tablet Take 1 tablet (50 mg total) by mouth every 2 (two) hours as needed for migraine. May repeat in 2 hours if headache persists or recurs.  01/17/16  Yes Pete Glatterawn T Langeland, MD  Vitamin D, Ergocalciferol, (DRISDOL) 50000 units CAPS capsule Take 1 capsule (50,000 Units total) by mouth every 7 (seven) days. 01/25/16  Yes Pete Glatterawn T Langeland, MD  penicillin v potassium (VEETID) 500 MG tablet Take 1 tablet (500 mg total) by mouth 4 (four) times daily. 06/07/16 06/12/16  Quentin Angstlugbemiga E Kynesha Guerin, MD  prochlorperazine (COMPAZINE) 10 MG tablet Take 1 tablet (10 mg total) by mouth 2 (two) times daily as needed (for headache). Patient not taking: Reported on 06/07/2016 12/24/15   Margarita Grizzleanielle Ray, MD    Objective:   Vitals:   06/07/16 1041  BP: 115/66  Pulse: 82  Resp: 18  Temp: 98.2 F (36.8 C)  TempSrc: Oral  SpO2: 97%  Weight: 105 lb 9.6 oz (47.9 kg)  Height: 5\' 1"  (1.549 m)   Exam General appearance : Awake, alert, not in any distress. Speech Clear. Not toxic looking, poor dentition HEENT: Atraumatic and Normocephalic, pupils equally reactive to light and accomodation Neck: Supple, no JVD. No cervical lymphadenopathy.  Chest: Good air entry bilaterally, no added sounds  CVS: S1 S2 regular, no murmurs.  Abdomen: Bowel sounds present, Non tender and not distended with no gaurding, rigidity or rebound. Extremities: B/L Lower Ext shows no edema, both legs are warm to touch Neurology: Awake alert, and oriented X 3, CN II-XII intact, Non focal Skin: No Rash  Data  Review Lab Results  Component Value Date   HGBA1C 5.5 01/12/2014    Assessment & Plan   1. Tooth decay  - penicillin v potassium (VEETID) 500 MG tablet; Take 1 tablet (500 mg total) by mouth 4 (four) times daily.  Dispense: 20 tablet; Refill: 0 - acetaminophen (TYLENOL) 500 MG tablet; Take 2 tablets (1,000 mg total) by mouth every 8 (eight) hours as needed. Take 2 tabs every 8 hours.  Dispense: 30 tablet; Refill: 0  - Ambulatory referral to Dentistry - Urgent  Interpreter was used to communicate directly with patient for the entire encounter including providing detailed patient  instructions.   Return in about 3 months (around 09/07/2016) for Renal Ultrasound for F/U Cyst.  The patient was given clear instructions to go to ER or return to medical center if symptoms don't improve, worsen or new problems develop. The patient verbalized understanding. The patient was told to call to get lab results if they haven't heard anything in the next week.   This note has been created with Education officer, environmentalDragon speech recognition software and smart phrase technology. Any transcriptional errors are unintentional.    Jeanann LewandowskyJEGEDE, Candee Hoon, MD, MHA, Maxwell CaulFACP, FAAP, CPE [facility] [place], [place] [phone]   06/07/2016, 11:23 AM

## 2016-06-07 NOTE — Patient Instructions (Signed)
Dental Caries, Adult °Dental caries are spots of decay (cavities) in the outer layer of your tooth (enamel). The natural bacteria in your mouth produce acid when breaking down sugary foods and drinks. When you eat or drink a lot of sugary foods and liquids, a lot of acid is produced. The acid destroys the protective enamel of your tooth, leading to tooth decay. °It is important to treat your tooth decay as soon as possible. Untreated dental caries can spread decay and lead to painful infection. Brushing regularly with fluoride toothpaste (oral hygiene) and getting regular dental checkups can help prevent dental caries. °What are the causes? °Dental caries are caused by the acid that is produced when bacteria break down sugary or acidic foods and drinks. °What increases the risk? °This condition is more likely to develop in young adults. This condition is also more likely to develop in people who: °· Drink a lot of sugary liquids, including alcoholic drinks, such as champagne. °· Eat a lot of sweets and carbohydrates. °· Drink water that is not treated with fluoride. °· Have poor oral hygiene. °· Have deep grooves in their teeth. °· Take certain medicines that decrease saliva. °What are the signs or symptoms? °Symptoms of dental caries include: °· White, brown, or black spots on the teeth. °· Pain. °· Swollen or bleeding gums. °How is this diagnosed? °Your dentist may suspect dental caries from your signs and symptoms. The dentist will also do an oral exam. This may include X-rays to confirm the diagnosis. Sometimes lights, a thin probe, and dyes are used to find dental caries (using electrical conductivity or using laser reflection). °How is this treated? °Treatment for dental caries usually involves a procedure to remove the decay and restore the tooth with a filling or a sealant. °Follow these instructions at home: °· Practice good oral hygiene. This keeps your mouth and gums healthy. Use fluoride toothpaste to  brush your teeth twice a day, and floss once a day. °· If your dentist prescribed an antibiotic medicine to treat an infection, take it as told by your dentist. Do not stop taking the antibiotic even if your condition improves. °· Keep all follow-up visits as told by your dentist. This is important. This includes all cleanings. °How is this prevented? °To prevent dental caries: °· Brush your teeth every morning and night with fluoride toothpaste. °· Get regular dental cleanings. °· If you are at risk of dental caries, wash your mouth with prescription mouthwash (chlorhexidine) and apply topical fluoride to your teeth. °· Drink fluoridated water regularly. °· Drink water instead of sugary drinks. °· Eat healthy meals and snacks. °Contact a health care provider if: °· You have symptoms of tooth decay. °This information is not intended to replace advice given to you by your health care provider. Make sure you discuss any questions you have with your health care provider. °Document Released: 03/31/2002 Document Revised: 03/07/2016 Document Reviewed: 01/20/2016 °Elsevier Interactive Patient Education © 2017 Elsevier Inc. ° °

## 2016-06-07 NOTE — Progress Notes (Signed)
Patient is here for dental pain  Patient complains of dental pain being present for the past two weeks. Patient has taken tylenol and been provided with relief.  Patient has taken medication today. Patient has eaten today.  Patient request refill on nasal spray.  Patient tolerated flu injection well today.

## 2016-06-25 ENCOUNTER — Other Ambulatory Visit: Payer: Self-pay | Admitting: Internal Medicine

## 2016-06-25 DIAGNOSIS — Z1231 Encounter for screening mammogram for malignant neoplasm of breast: Secondary | ICD-10-CM

## 2016-09-17 ENCOUNTER — Other Ambulatory Visit: Payer: Self-pay | Admitting: Internal Medicine

## 2016-09-17 DIAGNOSIS — E785 Hyperlipidemia, unspecified: Secondary | ICD-10-CM

## 2016-09-18 ENCOUNTER — Other Ambulatory Visit: Payer: Self-pay | Admitting: Internal Medicine

## 2016-09-18 ENCOUNTER — Other Ambulatory Visit: Payer: Self-pay | Admitting: Family Medicine

## 2016-09-18 DIAGNOSIS — E785 Hyperlipidemia, unspecified: Secondary | ICD-10-CM

## 2016-09-20 ENCOUNTER — Other Ambulatory Visit: Payer: Self-pay | Admitting: Pharmacist

## 2016-09-20 ENCOUNTER — Other Ambulatory Visit: Payer: Self-pay | Admitting: Internal Medicine

## 2016-09-20 DIAGNOSIS — E785 Hyperlipidemia, unspecified: Secondary | ICD-10-CM

## 2016-09-20 MED ORDER — SIMVASTATIN 40 MG PO TABS: 40.0000 mg | ORAL_TABLET | Freq: Every day | ORAL | 3 refills | Status: DC

## 2016-09-20 MED FILL — SIMVASTATIN 40 MG TABLET: 40 | 90 days supply | Qty: 90 | Fill #0

## 2016-09-20 NOTE — Telephone Encounter (Signed)
Pt. Called stating she called the pharmacy requesting a refill on simvastatin (ZOCOR) 40 MG tablet  Called pharmacy and they stated that they have not received a new Rx for the pt. Please f/u

## 2016-09-20 NOTE — Telephone Encounter (Signed)
Resent the simvastatin prescription

## 2016-10-03 ENCOUNTER — Encounter: Payer: Self-pay | Admitting: Internal Medicine

## 2016-10-03 ENCOUNTER — Ambulatory Visit: Payer: Self-pay | Attending: Internal Medicine | Admitting: Internal Medicine

## 2016-10-03 VITALS — BP 117/73 | HR 83 | Temp 97.6°F | Resp 18 | Ht 61.0 in | Wt 105.0 lb

## 2016-10-03 DIAGNOSIS — E785 Hyperlipidemia, unspecified: Secondary | ICD-10-CM

## 2016-10-03 DIAGNOSIS — M25552 Pain in left hip: Secondary | ICD-10-CM | POA: Insufficient documentation

## 2016-10-03 DIAGNOSIS — N281 Cyst of kidney, acquired: Secondary | ICD-10-CM

## 2016-10-03 DIAGNOSIS — Z79899 Other long term (current) drug therapy: Secondary | ICD-10-CM | POA: Insufficient documentation

## 2016-10-03 DIAGNOSIS — J302 Other seasonal allergic rhinitis: Secondary | ICD-10-CM

## 2016-10-03 DIAGNOSIS — E559 Vitamin D deficiency, unspecified: Secondary | ICD-10-CM

## 2016-10-03 DIAGNOSIS — K219 Gastro-esophageal reflux disease without esophagitis: Secondary | ICD-10-CM

## 2016-10-03 DIAGNOSIS — G43009 Migraine without aura, not intractable, without status migrainosus: Secondary | ICD-10-CM

## 2016-10-03 DIAGNOSIS — K029 Dental caries, unspecified: Secondary | ICD-10-CM | POA: Insufficient documentation

## 2016-10-03 LAB — LIPID PANEL
CHOL/HDL RATIO: 2.8 ratio (ref <5.0)
CHOLESTEROL: 185 mg/dL (ref <200)
HDL: 65 mg/dL (ref >50)
LDL CALC: 100 mg/dL — AB (ref <100)
Triglycerides: 102 mg/dL (ref <150)
VLDL: 20 mg/dL (ref <30)

## 2016-10-03 LAB — COMPLETE METABOLIC PANEL WITH GFR
ALBUMIN: 4.3 g/dL (ref 3.6–5.1)
ALT: 30 U/L — ABNORMAL HIGH (ref 6–29)
AST: 28 U/L (ref 10–35)
Alkaline Phosphatase: 66 U/L (ref 33–130)
BUN: 9 mg/dL (ref 7–25)
CHLORIDE: 103 mmol/L (ref 98–110)
CO2: 31 mmol/L (ref 20–31)
Calcium: 9.3 mg/dL (ref 8.6–10.4)
Creat: 0.52 mg/dL (ref 0.50–0.99)
GFR, Est African American: >89 mL/min (ref >=60)
GFR, Est Non African American: >89 mL/min (ref >=60)
GLUCOSE: 61 mg/dL — AB (ref 65–99)
POTASSIUM: 3.7 mmol/L (ref 3.5–5.3)
SODIUM: 140 mmol/L (ref 135–146)
Total Bilirubin: 0.8 mg/dL (ref 0.2–1.2)
Total Protein: 7.7 g/dL (ref 6.1–8.1)

## 2016-10-03 LAB — POCT GLYCOSYLATED HEMOGLOBIN (HGB A1C): Hemoglobin A1C: 5.6

## 2016-10-03 MED ORDER — VITAMIN D (ERGOCALCIFEROL) 1.25 MG (50000 UNIT) PO CAPS: 50000.0000 [IU] | ORAL_CAPSULE | ORAL | 0 refills | Status: DC

## 2016-10-03 MED ORDER — PANTOPRAZOLE SODIUM 40 MG PO TBEC: 40.0000 mg | DELAYED_RELEASE_TABLET | Freq: Every day | ORAL | 3 refills | Status: DC

## 2016-10-03 MED ORDER — FLUTICASONE PROPIONATE 50 MCG/ACT NA SUSP: 1.0000 | Freq: Every day | NASAL | 2 refills | Status: DC

## 2016-10-03 MED ORDER — SIMVASTATIN 40 MG PO TABS: 40.0000 mg | ORAL_TABLET | Freq: Every day | ORAL | 3 refills | Status: DC

## 2016-10-03 MED ORDER — NORTRIPTYLINE HCL 25 MG PO CAPS: 25.0000 mg | ORAL_CAPSULE | Freq: Every day | ORAL | 5 refills | Status: DC

## 2016-10-03 MED FILL — FLUTICASONE PROP 50 MCG SPR: 50 | 60 days supply | Qty: 16 | Fill #0

## 2016-10-03 MED FILL — ?PANTOPRAZOLE SOD DR 40MG: 40 MG | 30 days supply | Qty: 30 | Fill #0

## 2016-10-03 MED FILL — NORTRIPTYLINE HCL 25 MG CAP: 25 | 30 days supply | Qty: 30 | Fill #0

## 2016-10-03 MED FILL — VIT D2 1.25 MG (50,000 UNIT: 1.25 MG | 84 days supply | Qty: 12 | Fill #0

## 2016-10-03 NOTE — Progress Notes (Signed)
Patient is here for FU cyst US  Patient complains of left side hip pain being present for the past year. Pain is scaled at a 7 currently.  Patient has taken medication today. Patient has eaten today.

## 2016-10-03 NOTE — Progress Notes (Signed)
Subjective:  Patient ID: Judith Sherman, female    DOB: 30-May-1956  Age: 61 y.o. MRN: [id number]  CC: Follow-up   HPI Judith Sherman presents for follow up for renal cyst. In 2015, a ct of abdomen and pelvis showed a cyst on the right kidney measuring 1.2cm. At the time the patient had been complaining of abdominal pain and weight loss. She continues to have pain in her abdomen but sporadically 3-4 times a week. She did see the dentist and says her dental pain is improved and the dentist recommended a partial, a crown, or implant. She provides paperwork detailing options and costs but is concerned about payment. She also has a hx of left hip pain that she feels is worse recently. She treats the pain at home with exercise, hot oils, and massage. She had previously been given a referral to physical therapy but insurance denied. She complains of new urinary frequency. She reports going 7-8 times a night and voiding a full amount. She denies pain when she urinates but does localize some occasional pain to the right lower quadrant anterior side. She has a hx of chronic rlq abdominal pain that she says has not improved over several years. She is concerned that she has blood in her urine and would like to have her urine checked today. She previously had a low vitamin d lab result. She says she has not been taking her vitamin d supplement consistently. She does participate in weight bearing exercises and has not fallen recently and does not believe she has had fractures.   [place] interpreter was used to communicate directly with patient for the entire encounter including providing detailed patient instructions.   Past Medical History:  Diagnosis Date  . Allergic rhinitis   . Hyperlipidemia   . Hypertension   . LPRD (laryngopharyngeal reflux disease)    Evaluated by VZD:[facility]    Past Surgical History:  Procedure Laterality Date  . BREAST LUMPECTOMY     benign  . COLONOSCOPY  N/A 07/12/2014   SLF:  1. small ulcer in teh terminal ileum 2. Small internal hemorrhoids.  . TONSILLECTOMY      Family History  Problem Relation Age of Onset  . Colon cancer Neg Hx     Social History  Substance Use Topics  . Smoking status: Never Smoker  . Smokeless tobacco: Never Used  . Alcohol use No   ROS Review of Systems  Constitutional: Negative.   HENT: Negative.   Eyes: Negative.   Respiratory: Negative.   Cardiovascular: Negative.   Genitourinary: Positive for dysuria and frequency. Negative for flank pain.  Musculoskeletal: Positive for arthralgias.  Skin: Negative.   Neurological: Negative.   Psychiatric/Behavioral: Negative.     Objective:   Today's Vitals: BP 117/73 (BP Location: Left Arm, Patient Position: Sitting, Cuff Size: Normal)   Pulse 83   Temp 97.6 F (36.4 C) (Oral)   Resp 18   Ht 5\' 1"  (1.549 m)   Wt 105 lb (47.6 kg)   SpO2 99%   BMI 19.84 kg/m   Physical Exam  Constitutional: She is oriented to person, place, and time. She appears well-developed and well-nourished.  HENT:  Head: Normocephalic and atraumatic.  Eyes: Conjunctivae are normal. Pupils are equal, round, and reactive to light.  Neck: Normal range of motion. Neck supple. No JVD present.  Cardiovascular: Normal rate, regular rhythm and normal heart sounds.   Pulmonary/Chest: Effort normal and breath sounds normal. No respiratory distress.  Abdominal:  Soft. Bowel sounds are normal. She exhibits no distension. There is no tenderness.  Musculoskeletal: Normal range of motion. She exhibits no edema, tenderness or deformity.  Neurological: She is alert and oriented to person, place, and time.  Skin: Skin is warm and dry.  Psychiatric: She has a normal mood and affect. Her behavior is normal.    Assessment & Plan:   Renal Cyst - Patient has a known cyst ont he right kidney. Symptoms persist and it may be worthwhile to re-ultrasound the area to see if the cyst has changed.   -  renal ultrasound  Dental Caries - she needs to have some dental work done to replace the missing tooth. We will have her meet with our financial counselor to discuss if there are coverage options or if she will have to pay out of pocket.   Hyperlipidemia - Her cholesterol levels were stable at her last check in June 2017. She continues to take atorvastatin and requests a refill of this medication. We will plan to check her cholesterol at the next visit.   GERD - her symptoms appear stable and only bother her occassionally. She had been previously taking protonix but does not currently have this medicine. We can re-start this medicine today.  Vitamin D deficiency - We discussed her deficiency and encouraged her to take the vitamin d medication as prescribed. We may consider switching her to a daily medication if that can improve compliance. Since she has not been taking her medicine we will check the vitamin d level at her next visit.   Hip Pain - She may benefit from physical therapy but since she does not have insurance she would have to pay out of pocket. She does not feel this is an option so we will continue to treat with anti-inflammatories and consider pain medication in the future.   Outpatient Encounter Prescriptions as of 10/03/2016  Medication Sig  . acetaminophen (TYLENOL) 500 MG tablet Take 2 tablets (1,000 mg total) by mouth every 8 (eight) hours as needed. Take 2 tabs every 8 hours.  . cetirizine (ZYRTEC) 10 MG tablet Take 1 tablet (10 mg total) by mouth daily. One tab daily for allergies  . fluticasone (FLONASE) 50 MCG/ACT nasal spray Place 1 spray into both nostrils daily.  . meloxicam (MOBIC) 7.5 MG tablet Take 7.5 mg by mouth daily.  . nortriptyline (PAMELOR) 25 MG capsule Take 1 capsule (25 mg total) by mouth at bedtime.  . simvastatin (ZOCOR) 40 MG tablet Take 1 tablet (40 mg total) by mouth at bedtime.  . SUMAtriptan (IMITREX) 50 MG tablet Take 1 tablet (50 mg total) by mouth  every 2 (two) hours as needed for migraine. May repeat in 2 hours if headache persists or recurs.  . Vitamin D, Ergocalciferol, (DRISDOL) 50000 units CAPS capsule Take 1 capsule (50,000 Units total) by mouth every 7 (seven) days.  . [DISCONTINUED] fluticasone (FLONASE) 50 MCG/ACT nasal spray Place 1 spray into both nostrils daily.   . [DISCONTINUED] nortriptyline (PAMELOR) 25 MG capsule Take 1 capsule (25 mg total) by mouth at bedtime.  . [DISCONTINUED] simvastatin (ZOCOR) 40 MG tablet Take 1 tablet (40 mg total) by mouth at bedtime.  . [DISCONTINUED] Vitamin D, Ergocalciferol, (DRISDOL) 50000 units CAPS capsule Take 1 capsule (50,000 Units total) by mouth every 7 (seven) days.  . pantoprazole (PROTONIX) 40 MG tablet Take 1 tablet (40 mg total) by mouth daily.  . prochlorperazine (COMPAZINE) 10 MG tablet Take 1 tablet (10 mg total)  by mouth 2 (two) times daily as needed (for headache). (Patient not taking: Reported on 06/07/2016)   No facility-administered encounter medications on file as of 10/03/2016.     Follow-up: Return in about 6 months (around 04/05/2017) for Routine Follow Up.    Consuello Masse, AGNP Student  Evaluation and management procedures were performed by me with DNP Student in attendance, note written by DNP student under my supervision and collaboration. I have reviewed the note and I agree with the management and plan.   Jeanann Lewandowsky, MD, MHA, CPE, FACP, FAAP [facility] [place], [place] [phone]   10/06/2016, 11:01 AM

## 2016-10-04 LAB — URINALYSIS, COMPLETE
Bacteria, UA: NONE SEEN [HPF]
Bilirubin Urine: NEGATIVE
CASTS: NONE SEEN [LPF]
Crystals: NONE SEEN [HPF]
GLUCOSE, UA: NEGATIVE
Ketones, ur: NEGATIVE
LEUKOCYTES UA: NEGATIVE
NITRITE: NEGATIVE
PH: 7 (ref 5.0–8.0)
PROTEIN: NEGATIVE
RBC / HPF: NONE SEEN RBC/HPF (ref <=2)
Specific Gravity, Urine: 1.004 (ref 1.001–1.035)
Squamous Epithelial / LPF: NONE SEEN [HPF] (ref <=5)
WBC, UA: NONE SEEN WBC/HPF (ref <=5)
YEAST: NONE SEEN [HPF]

## 2016-10-04 LAB — VITAMIN D 25 HYDROXY (VIT D DEFICIENCY, FRACTURES): Vit D, 25-Hydroxy: 35 ng/mL (ref 30–100)

## 2016-10-05 ENCOUNTER — Telehealth: Payer: Self-pay | Admitting: *Deleted

## 2016-10-05 NOTE — Telephone Encounter (Signed)
MA informed patient of labs being normal including her vitamin D. Medical Assistant left message on patient's home and cell voicemail. Voicemail states to give a call back [facility] [place] [facility] [phone].

## 2016-10-05 NOTE — Telephone Encounter (Signed)
-----   Message from Quentin Angstlugbemiga E Jegede, MD sent at 10/05/2016  9:53 AM EDT ----- Lab results are mostly within normal limits including Vitamin D

## 2016-10-08 ENCOUNTER — Other Ambulatory Visit: Payer: Self-pay | Admitting: *Deleted

## 2016-10-08 ENCOUNTER — Telehealth: Payer: Self-pay | Admitting: *Deleted

## 2016-10-08 DIAGNOSIS — M5136 Other intervertebral disc degeneration, lumbar region: Secondary | ICD-10-CM

## 2016-10-08 NOTE — Progress Notes (Signed)
Reordered imaging based on new codes with [facility] scheduling. Patient scheduled for 10/10/16 at 9:00am.

## 2016-10-08 NOTE — Telephone Encounter (Signed)
Patient verified DOB Patient is aware of US renal being scheduled for this Wednesday 10/10/16 at 9:00 am. Patient advised to be NPO after midnight on Tuesday. Patient also aware of no fatty foods being consumed on Tuesday. Patient wrote down all information and expressed her understanding. No further questions at this time.

## 2016-10-09 ENCOUNTER — Telehealth: Payer: Self-pay | Admitting: *Deleted

## 2016-10-09 NOTE — Telephone Encounter (Signed)
Patient is aware of US being rescheduled for 7:00 am instead of 9:00 am on tomorrow at [facility]. Patient should have a full bladder. Do not urinate prior to going to the appointment. Medical Assistant left message on patient's home and cell voicemail. Voicemail states to give a call back to [place] [facility] at [phone].

## 2016-10-10 ENCOUNTER — Ambulatory Visit (HOSPITAL_COMMUNITY): Admission: RE | Admit: 2016-10-10 | Payer: Self-pay | Source: Ambulatory Visit

## 2016-10-10 ENCOUNTER — Ambulatory Visit (HOSPITAL_COMMUNITY)
Admission: RE | Admit: 2016-10-10 | Discharge: 2016-10-10 | Disposition: A | Discharge status: Home / Self Care | Payer: Self-pay | Source: Ambulatory Visit | Attending: Internal Medicine | Admitting: Internal Medicine

## 2016-10-10 DIAGNOSIS — N281 Cyst of kidney, acquired: Secondary | ICD-10-CM | POA: Insufficient documentation

## 2016-10-18 ENCOUNTER — Telehealth: Payer: Self-pay | Admitting: *Deleted

## 2016-10-18 NOTE — Telephone Encounter (Signed)
-----   Message from Quentin Angstlugbemiga E Jegede, MD sent at 10/11/2016  8:29 PM EDT ----- Please inform the patient that her ultrasound showed stable Right renal simple cyst. No acute abnormality noted

## 2016-10-18 NOTE — Telephone Encounter (Signed)
Patient verified DOB Patient is aware of simple cyst being stable. Patient expressed her understanding and had no further questions at this time.

## 2017-04-30 ENCOUNTER — Encounter: Payer: Self-pay | Admitting: Family Medicine

## 2017-04-30 ENCOUNTER — Ambulatory Visit (INDEPENDENT_AMBULATORY_CARE_PROVIDER_SITE_OTHER): Payer: BLUE CROSS/BLUE SHIELD | Admitting: Family Medicine

## 2017-04-30 VITALS — BP 129/79 | HR 83 | Temp 98.3°F | Resp 17 | Ht 61.5 in | Wt 107.0 lb

## 2017-04-30 DIAGNOSIS — R1011 Right upper quadrant pain: Secondary | ICD-10-CM

## 2017-04-30 DIAGNOSIS — K219 Gastro-esophageal reflux disease without esophagitis: Secondary | ICD-10-CM | POA: Diagnosis not present

## 2017-04-30 DIAGNOSIS — Z789 Other specified health status: Secondary | ICD-10-CM | POA: Diagnosis not present

## 2017-04-30 LAB — COMPREHENSIVE METABOLIC PANEL
ALBUMIN: 4.4 g/dL (ref 3.6–4.8)
ALT: 17 IU/L (ref 0–32)
AST: 23 IU/L (ref 0–40)
Albumin/Globulin Ratio: 1.5 (ref 1.2–2.2)
Alkaline Phosphatase: 76 IU/L (ref 39–117)
BUN / CREAT RATIO: 15 (ref 12–28)
BUN: 11 mg/dL (ref 8–27)
Bilirubin Total: 0.4 mg/dL (ref 0.0–1.2)
CALCIUM: 9.1 mg/dL (ref 8.7–10.3)
CHLORIDE: 106 mmol/L (ref 96–106)
CO2: 21 mmol/L (ref 20–29)
CREATININE: 0.71 mg/dL (ref 0.57–1.00)
GFR, EST AFRICAN AMERICAN: 106 mL/min/{1.73_m2} (ref >59)
GFR, EST NON AFRICAN AMERICAN: 92 mL/min/{1.73_m2} (ref >59)
GLUCOSE: 116 mg/dL — AB (ref 65–99)
Globulin, Total: 2.9 g/dL (ref 1.5–4.5)
Potassium: 3.6 mmol/L (ref 3.5–5.2)
Sodium: 143 mmol/L (ref 134–144)
TOTAL PROTEIN: 7.3 g/dL (ref 6.0–8.5)

## 2017-04-30 LAB — CBC
HEMOGLOBIN: 12.8 g/dL (ref 11.1–15.9)
Hematocrit: 39.2 % (ref 34.0–46.6)
MCH: 30.5 pg (ref 26.6–33.0)
MCHC: 32.7 g/dL (ref 31.5–35.7)
MCV: 93 fL (ref 79–97)
PLATELETS: 357 10*3/uL (ref 150–379)
RBC: 4.2 x10E6/uL (ref 3.77–5.28)
RDW: 13.1 % (ref 12.3–15.4)
WBC: 5.6 10*3/uL (ref 3.4–10.8)

## 2017-04-30 MED ORDER — PANTOPRAZOLE SODIUM 40 MG PO TBEC: 40.0000 mg | DELAYED_RELEASE_TABLET | Freq: Every day | ORAL | 3 refills | Status: DC

## 2017-04-30 NOTE — Addendum Note (Signed)
Addended by: Meredith Staggers R on: 04/30/2017 11:45 AM   Modules accepted: Orders, Level of Service

## 2017-04-30 NOTE — Progress Notes (Signed)
Subjective:  This chart was scribed for Shade Flood, MD by Veverly Fells, at [facility].  This patient was seen in room 3 and the patient's care was started at 10:59 AM.   Chief Complaint  Patient presents with  . Abdominal Pain  . URI     Patient ID: Judith Sherman, female    DOB: 10/02/1955, 61 y.o.   MRN: [id number]  HPI HPI Comments: Judith Sherman is a 61 y.o. female who presents to the [facility] complaining of intermittent right sided abdominal pain onset about 2 weeks ago. Her abdominal pain initially started about 2 years ago and then went away shortly after.  Denies nausea/vomitting.  Patient does not feel that her abdominal pain is related to eating and she has never had any operations on her abdomen. Patient takes Pantoprazole once per day for heart burn and feels that it helps with her symptoms. She was diagnosed with acid reflux about 2-3 years ago.  Denies fever/ chills, cough, night sweats or unexplained weight loss.   She is also complaining of increased mucous/saliva coming "back up her throat" which started around 1 month ago.  When she feels congested, it results in her having to spit or brush her teeth.  She also has associated symptoms of sinus congestion. Patient has had these symptoms previously.  She has used Flonase and Zyrtec in the past.   She has a history of GERD, hyperlipidemia, allergic rhinitis.  Takes Protonix 40 mg QD for GERD symptoms.   Interpreter number: [phone] -- Patient speaks [place]    Patient Active Problem List   Diagnosis Date Noted  . Hypovitaminosis D 10/03/2016  . Tooth decay 06/07/2016  . Language barrier 02/24/2016  . Vaginal dryness 02/24/2016  . Migraine without aura and responsive to treatment 12/14/2015  . Other seasonal allergic rhinitis 12/14/2015  . Degenerative disc disease, lumbar 05/20/2015  . Hyperlipidemia 05/20/2015  . Loss of weight 05/06/2014  . Gastroesophageal  reflux disease without esophagitis 05/06/2014  . Dyslipidemia 05/06/2014  . Annual physical exam 01/18/2014  . Establishing care with new doctor, encounter for 01/12/2014   Past Medical History:  Diagnosis Date  . Allergic rhinitis   . Hyperlipidemia   . Hypertension   . LPRD (laryngopharyngeal reflux disease)    Evaluated by [facility]   Past Surgical History:  Procedure Laterality Date  . BREAST LUMPECTOMY     benign  . COLONOSCOPY N/A 07/12/2014   SLF:  1. small ulcer in teh terminal ileum 2. Small internal hemorrhoids.  . TONSILLECTOMY     Allergies  Allergen Reactions  . Aspirin    Prior to Admission medications   Medication Sig Start Date End Date Taking? Authorizing Provider  acetaminophen (TYLENOL) 500 MG tablet Take 2 tablets (1,000 mg total) by mouth every 8 (eight) hours as needed. Take 2 tabs every 8 hours. 06/07/16  Yes Quentin Angst, MD  cetirizine (ZYRTEC) 10 MG tablet Take 1 tablet (10 mg total) by mouth daily. One tab daily for allergies 12/06/15  Yes Kindl, Quita Skye, MD  fluticasone [facility]) 50 MCG/ACT nasal spray Place 1 spray into both nostrils daily. 10/03/16  Yes Quentin Angst, MD  meloxicam (MOBIC) 7.5 MG tablet Take 7.5 mg by mouth daily. 10/10/15  Yes [provider]  nortriptyline (PAMELOR) 25 MG capsule Take 1 capsule (25 mg total) by mouth at bedtime. 10/03/16  Yes Quentin Angst, MD  pantoprazole (PROTONIX)  40 MG tablet Take 1 tablet (40 mg total) by mouth daily. 10/03/16  Yes Quentin Angst, MD  prochlorperazine (COMPAZINE) 10 MG tablet Take 1 tablet (10 mg total) by mouth 2 (two) times daily as needed (for headache). 12/24/15  Yes Margarita Grizzle, MD  simvastatin (ZOCOR) 40 MG tablet Take 1 tablet (40 mg total) by mouth at bedtime. 10/03/16  Yes Quentin Angst, MD  SUMAtriptan (IMITREX) 50 MG tablet Take 1 tablet (50 mg total) by mouth every 2 (two) hours as needed for migraine. May repeat in 2  hours if headache persists or recurs. 01/17/16  Yes Langeland, Dawn T, MD  Vitamin D, Ergocalciferol, (DRISDOL) 50000 units CAPS capsule Take 1 capsule (50,000 Units total) by mouth every 7 (seven) days. 10/03/16  Yes Quentin Angst, MD   Social History   Social History  . Marital status: Divorced    Spouse name: N/A  . Number of children: 0  . Years of education: N/A   Occupational History  . unemployed    Social History Main Topics  . Smoking status: Never Smoker  . Smokeless tobacco: Never Used  . Alcohol use No  . Drug use: No  . Sexual activity: Not on file   Other Topics Concern  . Not on file   Social History Narrative  . No narrative on file      Review of Systems  Constitutional: Negative for chills, diaphoresis, fever and unexpected weight change.  HENT: Positive for congestion, postnasal drip and rhinorrhea.   Eyes: Negative for pain and redness.  Respiratory: Negative for cough, choking and shortness of breath.   Gastrointestinal: Positive for abdominal pain. Negative for nausea and vomiting.  Musculoskeletal: Negative for neck pain and neck stiffness.       Objective:   Physical Exam  Constitutional: She is oriented to person, place, and time. She appears well-developed and well-nourished. No distress.  HENT:  Head: Normocephalic and atraumatic.  Cardiovascular: Normal rate.   Pulmonary/Chest: Effort normal and breath sounds normal. No respiratory distress.  Abdominal: Soft. There is no tenderness. There is negative Murphy's sign.  Neurological: She is alert and oriented to person, place, and time.  Skin: Skin is warm and dry.  Psychiatric: She has a normal mood and affect. Her behavior is normal.   Vitals:   04/30/17 1032  BP: 129/79  Pulse: 83  Resp: 17  Temp: 98.3 F (36.8 C)  TempSrc: Oral  SpO2: 98%  Weight: 107 lb (48.5 kg)  Height: 5' 1.5" (1.562 m)       Assessment & Plan:   Judith Sherman is a 61 y.o. female Abdominal pain,  right upper quadrant - Plan: CBC, Comprehensive metabolic panel  Gastroesophageal reflux disease, esophagitis presence not specified  Language barrier  Intermittent right upper quadrant abdominal pain with typical reflux symptoms, some mucus in the back of the throat that may be postnasal drip from allergies versus reflux. No red flag symptoms on history and reassuring exam at present.  - Continue Protonix every morning, add Zantac at bedtime, avoidance of trigger foods for reflux.  - Check CMP, CBC for episodic right upper quadrant pain. If abdominal pain persists, consider ultrasound.  - RTC precautions discussed with recheck in 2 weeks if not improving.  - [place] video interpreter was used, understanding expressed  No orders of the defined types were placed in this encounter.  Patient Instructions   I will check liver tests and other electrolytes.  Avoid spicy food, fried  food for now. See other foods to avoid with heartburn. Recheck in next 2 weeks if not improving - sooner if worse.   Mucus in throat may be due to heartburn or allergies. Continue pantoprazole daily, zyrtec and and flonase for allergies, zantac over the counter at night. Recheck in next 2 weeks if this symptoms is not improving.   Food Choices for Gastroesophageal Reflux Disease, Adult When you have gastroesophageal reflux disease (GERD), the foods you eat and your eating habits are very important. Choosing the right foods can help ease the discomfort of GERD. Consider working with a diet and nutrition specialist (dietitian) to help you make healthy food choices. What general guidelines should I follow? Eating plan  Choose healthy foods low in fat, such as fruits, vegetables, whole grains, low-fat dairy products, and lean meat, fish, and poultry.  Eat frequent, small meals instead of three large meals each day. Eat your meals slowly, in a relaxed setting. Avoid bending over or lying down until 2-3 hours after  eating.  Limit high-fat foods such as fatty meats or fried foods.  Limit your intake of oils, butter, and shortening to less than 8 teaspoons each day.  Avoid the following: ? Foods that cause symptoms. These may be different for different people. Keep a food diary to keep track of foods that cause symptoms. ? Alcohol. ? Drinking large amounts of liquid with meals. ? Eating meals during the 2-3 hours before bed.  Cook foods using methods other than frying. This may include baking, grilling, or broiling. Lifestyle   Maintain a healthy weight. Ask your health care provider what weight is healthy for you. If you need to lose weight, work with your health care provider to do so safely.  Exercise for at least 30 minutes on 5 or more days each week, or as told by your health care provider.  Avoid wearing clothes that fit tightly around your waist and chest.  Do not use any products that contain nicotine or tobacco, such as cigarettes and e-cigarettes. If you need help quitting, ask your health care provider.  Sleep with the head of your bed raised. Use a wedge under the mattress or blocks under the bed frame to raise the head of the bed. What foods are not recommended? The items listed may not be a complete list. Talk with your dietitian about what dietary choices are best for you. Grains Pastries or quick breads with added fat. [place] toast. Vegetables Deep fried vegetables. [place] fries. Any vegetables prepared with added fat. Any vegetables that cause symptoms. For some people this may include tomatoes and tomato products, chili peppers, onions and garlic, and horseradish. Fruits Any fruits prepared with added fat. Any fruits that cause symptoms. For some people this may include citrus fruits, such as oranges, grapefruit, pineapple, and lemons. Meats and other protein foods High-fat meats, such as fatty beef or pork, hot dogs, ribs, ham, sausage, salami and bacon. Fried meat or  protein, including fried fish and fried chicken. Nuts and nut butters. Dairy Whole milk and chocolate milk. Sour cream. Cream. Ice cream. Cream cheese. Milk shakes. Beverages Coffee and tea, with or without caffeine. Carbonated beverages. Sodas. Energy drinks. Fruit juice made with acidic fruits (such as orange or grapefruit). Tomato juice. Alcoholic drinks. Fats and oils Butter. Margarine. Shortening. Ghee. Sweets and desserts Chocolate and cocoa. Donuts. Seasoning and other foods Pepper. Peppermint and spearmint. Any condiments, herbs, or seasonings that cause symptoms. For some people, this may include curry,  hot sauce, or vinegar-based salad dressings. Summary  When you have gastroesophageal reflux disease (GERD), food and lifestyle choices are very important to help ease the discomfort of GERD.  Eat frequent, small meals instead of three large meals each day. Eat your meals slowly, in a relaxed setting. Avoid bending over or lying down until 2-3 hours after eating.  Limit high-fat foods such as fatty meat or fried foods. This information is not intended to replace advice given to you by your health care provider. Make sure you discuss any questions you have with your health care provider. Document Released: 07/09/2005 Document Revised: 07/10/2016 Document Reviewed: 07/10/2016 Elsevier Interactive Patient Education  2017 Elsevier Inc.   ? nng (Heartburn) ? nng l m?t lo?i ?au hay kh ch?u c th? x?y ra trong c? h?ng ho?c ng?c. N th??ng ???c m t? nh? m?t c?n ?au rt. N c?ng c th? gy ra m?t vi? ???ng trong mi?ng. ? nng c th? c?m th?y t?i t? h?n khi quy? vi? n?m xu?ng ho?c ci xu?ng, v n th??ng n?ng h?n vo ban ?m. ? nng c th? xa?y ra do ca?c th?? trong d? dy di chuy?n ng???c ln th?c qu?n (tra?o ng???c). H??NG D?N CH?M [place] T?I NH Th?c hi?n nh??ng hnh ??ng na?y ?? gi?m kh ch?u v gip trnh cc bi?n ch?ng. Ch? ?? ?n u?ng  Tun th? m?t ch? ?? ?n theo khuy?n ngh? c?a  chuyn gia ch?m [place] s?c kh?e. Vi?c ny c th? l trnh cc th?c ?n v ?? u?ng nh?: ? C ph v tr (c ho?c khng c caffeine). ? ?? u?ng c ch?a r??u. ? ?? u?ng t?ng l?c v ?? u?ng dng trong th? thao. ? ?? u?ng c ga ho?c soda. ? S c la v c ca. ? B?c h v h??ng v? b?c h. ? T?i v hnh. ? C?i ng?a (Horseradish). ? Cc th?c ?n nhi?u gia v? v a xt, bao g?m h?t tiu, b?t ?t, b?t ca ri, gi?m, n??c s?t cay, v n??c s?t barbecue. ? N??c qu? ho?c qu? h? cam qut, ch?ng h?n nh? cam, chanh v chanh l cam. ? Cc th?c ?n c c chua, nh? n??c x?t ??, ?t, n??c x?t salsa, v pizza km x?t ??. ? Th?c ?n chin v nhi?u ch?t bo, ch?ng h?n nh? bnh rn, khoai ty chin, khoai ty rn v n??c x?t nhi?u ch?t bo. ? Th?t nhi?u ch?t bo, ch?ng h?n nh? hot dog (bnh m k?p xc xch) v cc lo?i th?t ?? v tr?ng nhi?u m?, ch?ng h?n nh? th?t n?c l?ng, xc xch, gi?m bng v th?t l?n xng khi. ? Nh?ng s?n ph?m b? s?a giu ch?t bo, nh? s?a nguyn kem, b? v pho mt kem.  ?n cc b?a nh?, th??ng xuyn thay v cc b?a no.  Trnh u?ng nhi?u n??c khi qu v? ?n.  Trnh ?n trong kho?ng 2-3 gi? tr??c khi ?i ng?.  Trnh n?m xu?ng ngay sau khi ?n.  Khng t?p th? d?c ngay sau khi ?n. H??ng d?n chung  Ch  ??n nh?ng thay ??i v? tri?u ch?ng c?a qu v?.  Ch? s? d?ng thu?c khng c?n k ??n v thu?c c?n k ??n theo ch? d?n c?a chuyn gia ch?m [place] s?c kh?e. Khng dng aspirin, ibuprofen, ho?c cc thu?c NSAID khc tr? khi chuyn gia ch?m  s?c kh?e c?a qu v? ba?o quy? vi? du?ng.  Khng s? d?ng b?t k? s?n ph?m thu?c l no, bao g?m thu?c l d?ng ht, thu?c l d?ng  nhai v thu?c l ?i?n t?. N?u qu v? c?n gip ?? ?? cai thu?c, hy h?i chuyn gia ch?m [place] s?c kh?e.  M?c qu?n o r?ng. Khng m?c ci g ch?t quanh eo m c th? t?o p l?c ln b?ng quy? vi?.  Nng cao (nng) ??u gi??ng c?a quy? vi? kho?ng 6 inch (15 cm).  C? g?ng gi?m c?ng th?ng, ch?ng h?n nh? t?p yoga ho?c thi?n. N?u qu v? c?n gip ?? ?? gi?m  c?ng th?ng, hy h?i chuyn gia ch?m [place] s?c kh?e.  N?u qu v? th?a cn, hy gi?m cn n?ng v? m?c c l?i cho s?c kh?e c?a qu v?. Hy h?i chuyn gia ch?m [place] s?c kh?e ?? ???c h??ng d?n v? m?c tiu gi?m cn an ton.  Tun th? t?t c? cc cu?c h?n khm l?i theo  ki?n c?a chuyn gia ch?m [place] s?c kh?e. ?i?u ny c vai tr quan tr?ng. ?I KHM N?U:  Qu v? c cc tri?u ch?ng m?i.  Qu v? b? s?t cn khng r nguyn nhn.  Qu v? b? kh nu?t ho?c b? ?au khi nu?t.  Qu v? th? kh kh ho?c ho dai d?ng.  Cc tri?u ch?ng c?a qu v? khng c?i thi?n sau khi ???c ?i?u tr?[place] Rod? vi? bi? ? nng th??ng xuyn trong h?n hai tu?n.  NGAY L?P T?C ?I KHM N?U:  Qu v? b? ?au ? cnh tay, c?, hm, r?ng ho?c l?ng.  Qu v? th?y ?? m? hi, chng m?t ho?c chong vng.  Qu v? b? ?au ng?c ho?c th? d?c.  Qu v? nn v ch?t nn ra gi?ng nh? mu ho?c b c ph.  Phn c?a qu v? c mu ho?c mu ?en.  Thng tin ny khng nh?m m?c ?ch thay th? cho l?i khuyn m chuyn gia ch?m [place] s?c kh?e ni v?i qu v?. Hy b?o ??m qu v? ph?i th?o lu?n b?t k? v?n ?? g m qu v? c v?i chuyn gia ch?m [place] s?c kh?e c?a qu v?. Document Released: 10/31/2015 Document Revised: 10/31/2015 Document Reviewed: 11/03/2014 Elsevier Interactive Patient Education  2017 Elsevier Inc.  ?au b?ng, Ng??i l?n Abdominal Pain, Adult Nhi?u nguyn nhn c th? gy ra ?au b?ng. ?au b?ng th??ng khng nghim tr?ng v ?? m khng c?n ph?i ?i?u tr? ho?c khi ???c ?i?u tr? t?i nh. Tuy nhin, ?i khi ?au b?ng l nghim tr?ng. Chuyn gia ch?m [place] s?c kh?e c?a qu v? s? khai thc b?nh s? v khm th?c th? ?? xc ??nh nguyn nhn gy ra ?au b?ng. Tun th? nh?ng h??ng d?n ny ? nh:  Ch? s? d?ng thu?c khng k ??n v thu?c k ??n theo ch? d?n c?a chuyn gia ch?m [place] s?c kh?e. Khng dng thu?c nhu?n trng tr? khi ???c chuyn gia ch?m [place] s?c kh?e c?a qu v? ch? d?n.  U?ng ?? n??c ?? gi? cho n??c ti?u trong ho?c c mu vng nh?t.  Theo di tnh tr?ng c?a  qu v? ?? pht hi?n b?t k? thay ??i no.  Tun th? t?t c? cc cu?c h?n khm l?i theo ch? d?n c?a chuyn gia ch?m [place] s?c kh?e. ?i?u ny c vai tr quan tr?ng. Hy lin l?c v?i chuyn gia ch?m [place] s?c kh?e n?u:  Tnh tr?ng ?au b?ng c?a qu v? thay ??i ho?c tr?m tr?ng h?n.  Qu v? khng ?i ho?c qu v? b? s?t cn m khng c? g?ng gi?m cn.  Qu v? b? to bn ho?c b? tiu ch?y qu 2-3 ngy.  Qu v?  b? ?au khi ?i ti?u ho?c ?i ngoi.  ?au b?ng khi?n qu v? t?nh gi?c vo ban ?m.  ?au tr? nn tr?m tr?ng h?n khi ?n, sau khi ?n, ho?c khi ?n m?t s? th?c ph?m nh?t ??nh.  Qu v? nn v khng th? km l?i ???c.  Qu v? b? s?t. Yu c?u tr? gip ngay l?p t?c n?u:  C?n ?au c?a qu v? khng h?t s?m theo nh? d? ki?n c?a chuyn gia ch?m [place] s?c kh?e c?a qu v?.  Qu v? v?n ti?p t?c b? nn.  Ch? c?m th?y ?au ? cc vng c?a b?ng, ch?ng h?n nh? ? ph?n b?ng d??i bn ph?i ho?c bn tri.  Quy? vi? ?i ngoi phn c ma?u ho??c phn ma?u ?en, ho??c phn trng nh? h??c i?n.  Qu v? b? ?au r?t nhi?u, co th?t ho??c ch???ng b?ng.  Qu v? c cc d?u hi?u m?t n??c, ch?ng h?n nh?: ? N??c ti?u s?m mu, r?t t n??c ti?u, ho?c khng c n??c ti?u. ? Mi n?t n?. ? Mi?ng kh. ? M?t tr?ng. ? Bu?n ng?. ? Y?u. Thng tin ny khng nh?m m?c ?ch thay th? cho l?i khuyn m chuyn gia ch?m [place] s?c kh?e ni v?i qu v?. Hy b?o ??m qu v? ph?i th?o lu?n b?t k? v?n ?? g m qu v? c v?i chuyn gia ch?m [place] s?c kh?e c?a qu v?. Document Released: 07/09/2005 Document Revised: 06/28/2016 Document Reviewed: 12/21/2015 Elsevier Interactive Patient Education  2017 Elsevier Inc.     IF you received an x-ray today, you will receive an invoice from [facility]. Please contact [facility] at [phone] with questions or concerns regarding your invoice.   IF you received labwork today, you will receive an invoice from [place]. Please contact [facility] at [phone] with questions or concerns regarding your  invoice.   Our billing staff will not be able to assist you with questions regarding bills from these companies.  You will be contacted with the lab results as soon as they are available. The fastest way to get your results is to activate your My Chart account. Instructions are located on the last page of this paperwork. If you have not heard from [place] regarding the results in 2 weeks, please contact this office.       I personally performed the services described in this documentation, which was scribed in my presence. The recorded information has been reviewed and considered for accuracy and completeness, addended by me as needed, and agree with information above.  Signed,   Meredith Staggers, MD Primary Care at [facility].  04/30/17 11:16 AM

## 2017-04-30 NOTE — Patient Instructions (Addendum)
I will check liver tests and other electrolytes.  Avoid spicy food, fried food for now. See other foods to avoid with heartburn. Recheck in next 2 weeks if not improving - sooner if worse.   Mucus in throat may be due to heartburn or allergies. Continue pantoprazole daily, zyrtec and and flonase for allergies, zantac over the counter at night. Recheck in next 2 weeks if this symptoms is not improving.   Food Choices for Gastroesophageal Reflux Disease, Adult When you have gastroesophageal reflux disease (GERD), the foods you eat and your eating habits are very important. Choosing the right foods can help ease the discomfort of GERD. Consider working with a diet and nutrition specialist (dietitian) to help you make healthy food choices. What general guidelines should I follow? Eating plan  Choose healthy foods low in fat, such as fruits, vegetables, whole grains, low-fat dairy products, and lean meat, fish, and poultry.  Eat frequent, small meals instead of three large meals each day. Eat your meals slowly, in a relaxed setting. Avoid bending over or lying down until 2-3 hours after eating.  Limit high-fat foods such as fatty meats or fried foods.  Limit your intake of oils, butter, and shortening to less than 8 teaspoons each day.  Avoid the following: ? Foods that cause symptoms. These may be different for different people. Keep a food diary to keep track of foods that cause symptoms. ? Alcohol. ? Drinking large amounts of liquid with meals. ? Eating meals during the 2-3 hours before bed.  Cook foods using methods other than frying. This may include baking, grilling, or broiling. Lifestyle   Maintain a healthy weight. Ask your health care provider what weight is healthy for you. If you need to lose weight, work with your health care provider to do so safely.  Exercise for at least 30 minutes on 5 or more days each week, or as told by your health care provider.  Avoid wearing clothes  that fit tightly around your waist and chest.  Do not use any products that contain nicotine or tobacco, such as cigarettes and e-cigarettes. If you need help quitting, ask your health care provider.  Sleep with the head of your bed raised. Use a wedge under the mattress or blocks under the bed frame to raise the head of the bed. What foods are not recommended? The items listed may not be a complete list. Talk with your dietitian about what dietary choices are best for you. Grains Pastries or quick breads with added fat. [place] toast. Vegetables Deep fried vegetables. [place] fries. Any vegetables prepared with added fat. Any vegetables that cause symptoms. For some people this may include tomatoes and tomato products, chili peppers, onions and garlic, and horseradish. Fruits Any fruits prepared with added fat. Any fruits that cause symptoms. For some people this may include citrus fruits, such as oranges, grapefruit, pineapple, and lemons. Meats and other protein foods High-fat meats, such as fatty beef or pork, hot dogs, ribs, ham, sausage, salami and bacon. Fried meat or protein, including fried fish and fried chicken. Nuts and nut butters. Dairy Whole milk and chocolate milk. Sour cream. Cream. Ice cream. Cream cheese. Milk shakes. Beverages Coffee and tea, with or without caffeine. Carbonated beverages. Sodas. Energy drinks. Fruit juice made with acidic fruits (such as orange or grapefruit). Tomato juice. Alcoholic drinks. Fats and oils Butter. Margarine. Shortening. Ghee. Sweets and desserts Chocolate and cocoa. Donuts. Seasoning and other foods Pepper. Peppermint and spearmint. Any condiments, herbs,  or seasonings that cause symptoms. For some people, this may include curry, hot sauce, or vinegar-based salad dressings. Summary  When you have gastroesophageal reflux disease (GERD), food and lifestyle choices are very important to help ease the discomfort of GERD.  Eat frequent,  small meals instead of three large meals each day. Eat your meals slowly, in a relaxed setting. Avoid bending over or lying down until 2-3 hours after eating.  Limit high-fat foods such as fatty meat or fried foods. This information is not intended to replace advice given to you by your health care provider. Make sure you discuss any questions you have with your health care provider. Document Released: 07/09/2005 Document Revised: 07/10/2016 Document Reviewed: 07/10/2016 Elsevier Interactive Patient Education  2017 Elsevier Inc.   ? nng (Heartburn) ? nng l m?t lo?i ?au hay kh ch?u c th? x?y ra trong c? h?ng ho?c ng?c. N th??ng ???c m t? nh? m?t c?n ?au rt. N c?ng c th? gy ra m?t vi? ???ng trong mi?ng. ? nng c th? c?m th?y t?i t? h?n khi quy? vi? n?m xu?ng ho?c ci xu?ng, v n th??ng n?ng h?n vo ban ?m. ? nng c th? xa?y ra do ca?c th?? trong d? dy di chuy?n ng???c ln th?c qu?n (tra?o ng???c). H??NG D?N CH?M [place] T?I NH Th?c hi?n nh??ng hnh ??ng na?y ?? gi?m kh ch?u v gip trnh cc bi?n ch?ng. Ch? ?? ?n u?ng  Tun th? m?t ch? ?? ?n theo khuy?n ngh? c?a chuyn gia ch?m [place] s?c kh?e. Vi?c ny c th? l trnh cc th?c ?n v ?? u?ng nh?: ? C ph v tr (c ho?c khng c caffeine). ? ?? u?ng c ch?a r??u. ? ?? u?ng t?ng l?c v ?? u?ng dng trong th? thao. ? ?? u?ng c ga ho?c soda. ? S c la v c ca. ? B?c h v h??ng v? b?c h. ? T?i v hnh. ? C?i ng?a (Horseradish). ? Cc th?c ?n nhi?u gia v? v a xt, bao g?m h?t tiu, b?t ?t, b?t ca ri, gi?m, n??c s?t cay, v n??c s?t barbecue. ? N??c qu? ho?c qu? h? cam qut, ch?ng h?n nh? cam, chanh v chanh l cam. ? Cc th?c ?n c c chua, nh? n??c x?t ??, ?t, n??c x?t salsa, v pizza km x?t ??. ? Th?c ?n chin v nhi?u ch?t bo, ch?ng h?n nh? bnh rn, khoai ty chin, khoai ty rn v n??c x?t nhi?u ch?t bo. ? Th?t nhi?u ch?t bo, ch?ng h?n nh? hot dog (bnh m k?p xc xch) v cc lo?i th?t ?? v tr?ng nhi?u m?, ch?ng  h?n nh? th?t n?c l?ng, xc xch, gi?m bng v th?t l?n xng khi. ? Nh?ng s?n ph?m b? s?a giu ch?t bo, nh? s?a nguyn kem, b? v pho mt kem.  ?n cc b?a nh?, th??ng xuyn thay v cc b?a no.  Trnh u?ng nhi?u n??c khi qu v? ?n.  Trnh ?n trong kho?ng 2-3 gi? tr??c khi ?i ng?.  Trnh n?m xu?ng ngay sau khi ?n.  Khng t?p th? d?c ngay sau khi ?n. H??ng d?n chung  Ch  ??n nh?ng thay ??i v? tri?u ch?ng c?a qu v?.  Ch? s? d?ng thu?c khng c?n k ??n v thu?c c?n k ??n theo ch? d?n c?a chuyn gia ch?m [place] s?c kh?e. Khng dng aspirin, ibuprofen, ho?c cc thu?c NSAID khc tr? khi chuyn gia ch?m [place] s?c kh?e c?a qu v? ba?o quy? vi? du?ng.  Khng s? d?ng b?t k? s?n  ph?m thu?c l no, bao g?m thu?c l d?ng ht, thu?c l d?ng nhai v thu?c l ?i?n t?. N?u qu v? c?n gip ?? ?? cai thu?c, hy h?i chuyn gia ch?m [place] s?c kh?e.  M?c qu?n o r?ng. Khng m?c ci g ch?t quanh eo m c th? t?o p l?c ln b?ng quy? vi?.  Nng cao (nng) ??u gi??ng c?a quy? vi? kho?ng 6 inch (15 cm).  C? g?ng gi?m c?ng th?ng, ch?ng h?n nh? t?p yoga ho?c thi?n. N?u qu v? c?n gip ?? ?? gi?m c?ng th?ng, hy h?i chuyn gia ch?m [place] s?c kh?e.  N?u qu v? th?a cn, hy gi?m cn n?ng v? m?c c l?i cho s?c kh?e c?a qu v?. Hy h?i chuyn gia ch?m [place] s?c kh?e ?? ???c h??ng d?n v? m?c tiu gi?m cn an ton.  Tun th? t?t c? cc cu?c h?n khm l?i theo  ki?n c?a chuyn gia ch?m [place] s?c kh?e. ?i?u ny c vai tr quan tr?ng. ?I K[facility] N?U:  Qu v? c cc tri?u ch?ng m?i.  Qu v? b? s?t cn khng r nguyn nhn.  Qu v? b? kh nu?t ho?c b? ?au khi nu?t.  Qu v? th? kh kh ho?c ho dai d?ng.  Cc tri?u ch?ng c?a qu v? khng c?i thi?n sau khi ???c ?i?u tr?Anselmo Rod? vi? bi? ? nng th??ng xuyn trong h?n hai tu?n.  NGAY L?P T?C ?I K[facility] N?U:  Qu v? b? ?au ? cnh tay, c?, hm, r?ng ho?c l?ng.  Qu v? th?y ?? m? hi, chng m?t ho?c chong vng.  Qu v? b? ?au ng?c ho?c th? d?c.  Qu v? nn v ch?t nn ra gi?ng  nh? mu ho?c b c ph.  Phn c?a qu v? c mu ho?c mu ?en.  Thng tin ny khng nh?m m?c ?ch thay th? cho l?i khuyn m chuyn gia ch?m [place] s?c kh?e ni v?i qu v?. Hy b?o ??m qu v? ph?i th?o lu?n b?t k? v?n ?? g m qu v? c v?i chuyn gia ch?m [place] s?c kh?e c?a qu v?. Document Released: 10/31/2015 Document Revised: 10/31/2015 Document Reviewed: 11/03/2014 Elsevier Interactive Patient Education  2017 Elsevier Inc.  ?au b?ng, Ng??i l?n Abdominal Pain, Adult Nhi?u nguyn nhn c th? gy ra ?au b?ng. ?au b?ng th??ng khng nghim tr?ng v ?? m khng c?n ph?i ?i?u tr? ho?c khi ???c ?i?u tr? t?i nh. Tuy nhin, ?i khi ?au b?ng l nghim tr?ng. Chuyn gia ch?m [place] s?c kh?e c?a qu v? s? khai thc b?nh s? v khm th?c th? ?? xc ??nh nguyn nhn gy ra ?au b?ng. Tun th? nh?ng h??ng d?n ny ? nh:  Ch? s? d?ng thu?c khng k ??n v thu?c k ??n theo ch? d?n c?a chuyn gia ch?m [place] s?c kh?e. Khng dng thu?c nhu?n trng tr? khi ???c chuyn gia ch?m [place] s?c kh?e c?a qu v? ch? d?n.  U?ng ?? n??c ?? gi? cho n??c ti?u trong ho?c c mu vng nh?t.  Theo di tnh tr?ng c?a qu v? ?? pht hi?n b?t k? thay ??i no.  Tun th? t?t c? cc cu?c h?n khm l?i theo ch? d?n c?a chuyn gia ch?m [place] s?c kh?e. ?i?u ny c vai tr quan tr?ng. Hy lin l?c v?i chuyn gia ch?m [place] s?c kh?e n?u:  Tnh tr?ng ?au b?ng c?a qu v? thay ??i ho?c tr?m tr?ng h?n.  Qu v? khng ?i ho?c qu v? b? s?t cn m khng c? g?ng gi?m cn.  Qu v? b?  to bn ho?c b? tiu ch?y qu 2-3 ngy.  Qu v? b? ?au khi ?i ti?u ho?c ?i ngoi.  ?au b?ng khi?n qu v? t?nh gi?c vo ban ?m.  ?au tr? nn tr?m tr?ng h?n khi ?n, sau khi ?n, ho?c khi ?n m?t s? th?c ph?m nh?t ??nh.  Qu v? nn v khng th? km l?i ???c.  Qu v? b? s?t. Yu c?u tr? gip ngay l?p t?c n?u:  C?n ?au c?a qu v? khng h?t s?m theo nh? d? ki?n c?a chuyn gia ch?m [place] s?c kh?e c?a qu v?.  Qu v? v?n ti?p t?c b? nn.  Ch? c?m th?y ?au ? cc vng c?a  b?ng, ch?ng h?n nh? ? ph?n b?ng d??i bn ph?i ho?c bn tri.  Quy? vi? ?i ngoi phn c ma?u ho??c phn ma?u ?en, ho??c phn trng nh? h??c i?n.  Qu v? b? ?au r?t nhi?u, co th?t ho??c ch???ng b?ng.  Qu v? c cc d?u hi?u m?t n??c, ch?ng h?n nh?: ? N??c ti?u s?m mu, r?t t n??c ti?u, ho?c khng c n??c ti?u. ? Mi n?t n?. ? Mi?ng kh. ? M?t tr?ng. ? Bu?n ng?. ? Y?u. Thng tin ny khng nh?m m?c ?ch thay th? cho l?i khuyn m chuyn gia ch?m [place] s?c kh?e ni v?i qu v?. Hy b?o ??m qu v? ph?i th?o lu?n b?t k? v?n ?? g m qu v? c v?i chuyn gia ch?m [place] s?c kh?e c?a qu v?. Document Released: 07/09/2005 Document Revised: 06/28/2016 Document Reviewed: 12/21/2015 Elsevier Interactive Patient Education  2017 Elsevier Inc.     IF you received an x-ray today, you will receive an invoice from [facility]. Please contact [facility] at [phone] with questions or concerns regarding your invoice.   IF you received labwork today, you will receive an invoice from [place]. Please contact [facility] at [phone] with questions or concerns regarding your invoice.   Our billing staff will not be able to assist you with questions regarding bills from these companies.  You will be contacted with the lab results as soon as they are available. The fastest way to get your results is to activate your My Chart account. Instructions are located on the last page of this paperwork. If you have not heard from [place] regarding the results in 2 weeks, please contact this office.

## 2017-05-16 ENCOUNTER — Encounter: Payer: Self-pay | Admitting: Radiology

## 2017-05-16 ENCOUNTER — Other Ambulatory Visit: Payer: Self-pay | Admitting: Family Medicine

## 2017-05-16 DIAGNOSIS — R739 Hyperglycemia, unspecified: Secondary | ICD-10-CM

## 2017-08-24 ENCOUNTER — Ambulatory Visit (HOSPITAL_COMMUNITY)
Admission: EM | Admit: 2017-08-24 | Discharge: 2017-08-24 | Disposition: A | Discharge status: Home / Self Care | Payer: BLUE CROSS/BLUE SHIELD | Attending: Emergency Medicine | Admitting: Emergency Medicine

## 2017-08-24 ENCOUNTER — Encounter (HOSPITAL_COMMUNITY): Payer: Self-pay | Admitting: Emergency Medicine

## 2017-08-24 ENCOUNTER — Ambulatory Visit: Payer: BLUE CROSS/BLUE SHIELD | Admitting: Family Medicine

## 2017-08-24 ENCOUNTER — Other Ambulatory Visit: Payer: Self-pay

## 2017-08-24 DIAGNOSIS — J328 Other chronic sinusitis: Secondary | ICD-10-CM

## 2017-08-24 DIAGNOSIS — Z79899 Other long term (current) drug therapy: Secondary | ICD-10-CM | POA: Insufficient documentation

## 2017-08-24 DIAGNOSIS — K219 Gastro-esophageal reflux disease without esophagitis: Secondary | ICD-10-CM | POA: Diagnosis not present

## 2017-08-24 DIAGNOSIS — Z791 Long term (current) use of non-steroidal anti-inflammatories (NSAID): Secondary | ICD-10-CM | POA: Insufficient documentation

## 2017-08-24 DIAGNOSIS — E559 Vitamin D deficiency, unspecified: Secondary | ICD-10-CM | POA: Insufficient documentation

## 2017-08-24 DIAGNOSIS — J029 Acute pharyngitis, unspecified: Secondary | ICD-10-CM

## 2017-08-24 DIAGNOSIS — Z886 Allergy status to analgesic agent status: Secondary | ICD-10-CM | POA: Diagnosis not present

## 2017-08-24 DIAGNOSIS — I10 Essential (primary) hypertension: Secondary | ICD-10-CM | POA: Insufficient documentation

## 2017-08-24 DIAGNOSIS — M5136 Other intervertebral disc degeneration, lumbar region: Secondary | ICD-10-CM | POA: Insufficient documentation

## 2017-08-24 DIAGNOSIS — Z9889 Other specified postprocedural states: Secondary | ICD-10-CM | POA: Diagnosis not present

## 2017-08-24 DIAGNOSIS — J329 Chronic sinusitis, unspecified: Secondary | ICD-10-CM | POA: Insufficient documentation

## 2017-08-24 DIAGNOSIS — R634 Abnormal weight loss: Secondary | ICD-10-CM | POA: Insufficient documentation

## 2017-08-24 DIAGNOSIS — K029 Dental caries, unspecified: Secondary | ICD-10-CM | POA: Insufficient documentation

## 2017-08-24 DIAGNOSIS — R05 Cough: Secondary | ICD-10-CM | POA: Diagnosis present

## 2017-08-24 DIAGNOSIS — E7800 Pure hypercholesterolemia, unspecified: Secondary | ICD-10-CM | POA: Diagnosis not present

## 2017-08-24 DIAGNOSIS — G43009 Migraine without aura, not intractable, without status migrainosus: Secondary | ICD-10-CM | POA: Insufficient documentation

## 2017-08-24 DIAGNOSIS — Z800 Family history of malignant neoplasm of digestive organs: Secondary | ICD-10-CM | POA: Diagnosis not present

## 2017-08-24 LAB — POCT RAPID STREP A: STREPTOCOCCUS, GROUP A SCREEN (DIRECT): NEGATIVE

## 2017-08-24 MED ORDER — BENZONATATE 100 MG PO CAPS: 100.0000 mg | ORAL_CAPSULE | Freq: Three times a day (TID) | ORAL | 0 refills | Status: DC | PRN

## 2017-08-24 MED ORDER — AZITHROMYCIN 250 MG PO TABS: 250.0000 mg | ORAL_TABLET | Freq: Every day | ORAL | 0 refills | Status: DC

## 2017-08-24 NOTE — ED Provider Notes (Addendum)
[facility]    CSN: [id number] Arrival date & time: 08/24/17  1218     History   Chief Complaint Chief Complaint  Patient presents with  . Cough    HPI Judith Sherman [name] is a 62 y.o. female. Patient is a non-English speaker and information is obtained through video translation services through [place] [phone].    She is a 62 year old female presenting today with complaints of a sore throat with productive cough 1 week.  Patient states that she has a history of seasonal allergies with frequent sinus infections. Patient states that she has had sinus symptoms with congestion and pressure for approximately 4-6 weeks. Patient states that the sore throat and productive cough started approximately one week ago. Patient states the need this is silver in color. Patient states she's had no fever chills or diaphoresis. Denies shortness of breath other than with her "coughing fits". In addition, the patient states that she has occasional is" and is asking for prescription for Azelastine eye drops. Patient states she is allergic to aspirin and the only antibiotic that she is able to take is azithromycin.  Patient denies any significant medical problems other than high cholesterol for which she takes her medication regularly.  HPI  Past Medical History:  Diagnosis Date  . Allergic rhinitis   . Hyperlipidemia   . Hypertension   . LPRD (laryngopharyngeal reflux disease)    Eval[facility] by [facility]    Patient Active Problem List   Diagnosis Date Noted  . Hypovitaminosis D 10/03/2016  . Tooth decay 06/07/2016  . Language barrier 02/24/2016  . Vaginal dryness 02/24/2016  . Migraine without aura and responsive to treatment 12/14/2015  . Other seasonal allergic rhinitis 12/14/2015  . Degenerative disc disease, lumbar 05/20/2015  . Hyperlipidemia 05/20/2015  . Loss of weight 05/06/2014  . Gastroesophageal reflux disease without esophagitis 05/06/2014  .  Dyslipidemia 05/06/2014  . Annual physical exam 01/18/2014  . Establishing care with new doctor, encounter for 01/12/2014    Past Surgical History:  Procedure Laterality Date  . BREAST LUMPECTOMY     benign  . COLONOSCOPY N/A 07/12/2014   SLF:  1. small ulcer in teh terminal ileum 2. Small internal hemorrhoids.  . TONSILLECTOMY      OB History    Gravida Para Term Preterm AB Living   4       4 0   SAB TAB Ectopic Multiple Live Births   4              Obstetric Comments   SAB x 4        Home Medications    Prior to Admission medications   Medication Sig Start Date End Date Taking? Authorizing Provider  acetaminophen (TYLENOL) 500 MG tablet Take 2 tablets (1,000 mg total) by mouth every 8 (eight) hours as needed. Take 2 tabs every 8 hours. 06/07/16   Quentin AngstJegede, Olugbemiga E, MD  azithromycin (ZITHROMAX) 250 MG tablet Take 1 tablet (250 mg total) by mouth daily. Take first 2 tablets together, then 1 every day until finished. 08/24/17   Servando Salinaossi, Jemarcus Dougal H, NP  benzonatate (TESSALON) 100 MG capsule Take 1 capsule (100 mg total) by mouth 3 (three) times daily as needed for cough. 08/24/17   Servando Salinaossi, Lakena Sparlin H, NP  cetirizine (ZYRTEC) 10 MG tablet Take 1 tablet (10 mg total) by mouth daily. One tab daily for allergies 12/06/15   Linna HoffKindl, James D, MD  fluticasone [facility](FLONASE) 50 MCG/ACT nasal spray Place  1 spray into both nostrils daily. 10/03/16   Quentin Angst, MD  meloxicam (MOBIC) 7.5 MG tablet Take 7.5 mg by mouth daily. 10/10/15   [provider]  nortriptyline (PAMELOR) 25 MG capsule Take 1 capsule (25 mg total) by mouth at bedtime. 10/03/16   Quentin Angst, MD  pantoprazole (PROTONIX) 40 MG tablet Take 1 tablet (40 mg total) by mouth daily. 04/30/17   Shade Flood, MD  prochlorperazine (COMPAZINE) 10 MG tablet Take 1 tablet (10 mg total) by mouth 2 (two) times daily as needed (for headache). 12/24/15   Margarita Grizzle, MD  simvastatin (ZOCOR) 40 MG tablet Take 1 tablet  (40 mg total) by mouth at bedtime. 10/03/16   Quentin Angst, MD  SUMAtriptan (IMITREX) 50 MG tablet Take 1 tablet (50 mg total) by mouth every 2 (two) hours as needed for migraine. May repeat in 2 hours if headache persists or recurs. 01/17/16   Pete Glatter, MD  Vitamin D, Ergocalciferol, (DRISDOL) 50000 units CAPS capsule Take 1 capsule (50,000 Units total) by mouth every 7 (seven) days. 10/03/16   Quentin Angst, MD    Family History Family History  Problem Relation Age of Onset  . Colon cancer Neg Hx     Social History Social History   Tobacco Use  . Smoking status: Never Smoker  . Smokeless tobacco: Never Used  Substance Use Topics  . Alcohol use: No    Alcohol/week: 0.0 oz  . Drug use: No     Allergies   Aspirin   Review of Systems Review of Systems  Constitutional: Negative.  Negative for chills, diaphoresis, fatigue and fever.  HENT: Positive for congestion, postnasal drip, sinus pressure, sinus pain and sore throat. Negative for ear discharge, ear pain, facial swelling, trouble swallowing and voice change.   Eyes: Positive for itching. Negative for visual disturbance.  Respiratory: Positive for cough. Negative for choking, chest tightness, shortness of breath, wheezing and stridor.   Cardiovascular: Negative.  Negative for chest pain, palpitations and leg swelling.  Gastrointestinal: Negative for diarrhea, nausea and vomiting.  Endocrine: Negative.   Genitourinary: Negative.   Musculoskeletal: Negative.  Negative for gait problem and neck stiffness.  Skin: Negative.  Negative for rash.  Allergic/Immunologic: Negative.   Neurological: Negative.  Negative for dizziness and headaches.  Hematological: Negative.   Psychiatric/Behavioral: Negative.      Physical Exam Triage Vital Signs ED Triage Vitals [08/24/17 1316]  Enc Vitals Group     BP 117/66     Pulse Rate 71     Resp 18     Temp 98 F (36.7 C)     Temp src      SpO2 100 %     Weight       Height      Head Circumference      Peak Flow      Pain Score      Pain Loc      Pain Edu?      Excl. in GC?    No data found.  Updated Vital Signs BP 117/66   Pulse 71   Temp 98 F (36.7 C)   Resp 18   SpO2 100%    Physical Exam  Constitutional: She appears well-developed and well-nourished. No distress.  HENT:  Head: Normocephalic and atraumatic.  Bilateral tympanic membranes pearly gray in appearance with light reflexes present bony prominences were difficult to visualize due to the presence of serous otitis present bilaterally.  Patient has chronic changes in posterior oropharynx consistent with cobblestoning pattern and chronic sinus drainage. Bilateral nares patent with nasal turbinates swollen and boggy in appearance.   Eyes: Conjunctivae are normal. Pupils are equal, round, and reactive to light. Right eye exhibits no discharge. Left eye exhibits no discharge. No scleral icterus.  Neck: Normal range of motion. Neck supple. No thyromegaly present.  Moderate cervical lymphadenopathy present bilaterally. Negative for nuchal rigidity.  Cardiovascular: Normal rate, regular rhythm, normal heart sounds and intact distal pulses. Exam reveals no gallop and no friction rub.  No murmur heard. Pulmonary/Chest: Effort normal and breath sounds normal. No stridor. No respiratory distress. She has no rales. She exhibits no tenderness.  Lymphadenopathy:    She has cervical adenopathy.  Skin: She is not diaphoretic.  Nursing note and vitals reviewed.    UC Treatments / Results  Labs (all labs ordered are listed, but only abnormal results are displayed) Labs Reviewed  CULTURE, GROUP A STREP [facility])  POCT RAPID STREP A    EKG  EKG Interpretation None       Radiology No results found.  Procedures Procedures (including critical care time)  Medications Ordered in UC Medications - No data to display   Initial Impression / Assessment and Plan / UC Course  I have  reviewed the triage vital signs and the nursing notes.  Pertinent labs & imaging results that were available during my care of the patient were reviewed by me and considered in my medical decision making (see chart for details).    The patient is specifically requesting azithromycin to treat chronic sinus infection. Last dose of antibiotic was approximately 2 years ago. Due to the patient's illness feel this is a reasonable request. Requests Tessalon Pearls and something for her eye itching. I recommended Zaditor over-the-counter eyedrops.  Final Clinical Impressions(s) / UC Diagnoses   Final diagnoses:  Other chronic sinusitis    ED Discharge Orders        Ordered    azithromycin (ZITHROMAX) 250 MG tablet  Daily     08/24/17 1441    benzonatate (TESSALON) 100 MG capsule  3 times daily PRN     08/24/17 1441       Controlled Substance Prescriptions  The usual and customary discharge instructions and warnings were given.  The patient verbalizes understanding and agrees to plan of care.    [place] Controlled Substance Registry consulted? Not Applicable   Servando Salina, NP 08/24/17 1448    Servando Salina, NP 08/28/17 [phone]

## 2017-08-24 NOTE — ED Triage Notes (Signed)
Pt c/o cough, chest congestion, x1 week.

## 2017-08-24 NOTE — Discharge Instructions (Signed)
ZATIDOR EYE DROPS FOR EYE ITCHING

## 2017-08-26 ENCOUNTER — Ambulatory Visit: Payer: BLUE CROSS/BLUE SHIELD | Admitting: Family Medicine

## 2017-08-27 LAB — CULTURE, GROUP A STREP (THRC)

## 2017-11-12 ENCOUNTER — Ambulatory Visit: Payer: Self-pay

## 2017-11-12 NOTE — Telephone Encounter (Signed)
Patient called in with c/o "chest pain." She says "I had chest pain 4 days ago and it lasted about 30 minutes, rate a 4-5, on the left side. It did not radiate anywhere." I asked is she having pain now, she says "no, not since 4 days ago." I asked about other symptoms when she had the chest pain or now, she denies dizziness, nausea, vomiting, sweating, fever, difficulty breathing, cough. According to protocol, see PCP within 3 days, patient preferred a female physician and asked for Dr. Clelia CroftShaw. I advised no availability with Dr. Clelia CroftShaw and she will need to be seen this week. Appointment scheduled for tomorrow at 1140 with Dr. Creta LevinStallings, care advice given, patient verbalized understanding.  Reason for Disposition . [1] Chest pain(s) lasting a few seconds AND [2] persists > 3 days  Answer Assessment - Initial Assessment Questions 1. LOCATION: "Where does it hurt?"       Pain to left side 2. RADIATION: "Does the pain go anywhere else?" (e.g., into neck, jaw, arms, back)     No 3. ONSET: "When did the chest pain begin?" (Minutes, hours or days)      4 days ago; not now 4. PATTERN "Does the pain come and go, or has it been constant since it started?"  "Does it get worse with exertion?"      Constant for 30 minutes 5. DURATION: "How long does it last" (e.g., seconds, minutes, hours)     30 minutes 6. SEVERITY: "How bad is the pain?"  (e.g., Scale 1-10; mild, moderate, or severe)    - MILD (1-3): doesn't interfere with normal activities     - MODERATE (4-7): interferes with normal activities or awakens from sleep    - SEVERE (8-10): excruciating pain, unable to do any normal activities       4-5 7. CARDIAC RISK FACTORS: "Do you have any history of heart problems or risk factors for heart disease?" (e.g., prior heart attack, angina; high blood pressure, diabetes, being overweight, high cholesterol, smoking, or strong family history of heart disease)     High cholesterol 8. PULMONARY RISK FACTORS: "Do you  have any history of lung disease?"  (e.g., blood clots in lung, asthma, emphysema, birth control pills)     No 9. CAUSE: "What do you think is causing the chest pain?"     I don't know 10. OTHER SYMPTOMS: "Do you have any other symptoms?" (e.g., dizziness, nausea, vomiting, sweating, fever, difficulty breathing, cough)       No 11. PREGNANCY: "Is there any chance you are pregnant?" "When was your last menstrual period?"       No  Protocols used: CHEST PAIN-A-AH

## 2017-11-13 ENCOUNTER — Encounter: Payer: Self-pay | Admitting: Family Medicine

## 2017-11-13 ENCOUNTER — Other Ambulatory Visit: Payer: Self-pay

## 2017-11-13 ENCOUNTER — Other Ambulatory Visit: Payer: Self-pay | Admitting: Family Medicine

## 2017-11-13 ENCOUNTER — Ambulatory Visit: Payer: BLUE CROSS/BLUE SHIELD | Admitting: Family Medicine

## 2017-11-13 VITALS — BP 128/75 | HR 69 | Temp 98.0°F | Resp 17 | Ht 61.5 in | Wt 100.6 lb

## 2017-11-13 DIAGNOSIS — N644 Mastodynia: Secondary | ICD-10-CM

## 2017-11-13 NOTE — Addendum Note (Signed)
Addended by: Collie SiadSTALLINGS, Dannette Kinkaid A on: 11/13/2017 12:35 PM   Modules accepted: Level of Service

## 2017-11-13 NOTE — Progress Notes (Signed)
Chief Complaint  Patient presents with  . tenderness in breast with sharp pain in the left breast x 4     not taking any otc meds for tenderness or pain.    HPI   Pain started 4 days ago unprovoked She reports that she had a lump removed from the right breast that was not cancer  She denies family history of breast cancer No breast trauma No mammograms done recently No previous history of breast cancer Nonsmoker She denies a family history for breast cancer Menopause was 12 years She does not take any estrogen   Past Medical History:  Diagnosis Date  . Allergic rhinitis   . Hyperlipidemia   . Hypertension   . LPRD (laryngopharyngeal reflux disease)    Evaluated by [facility]    Current Outpatient Medications  Medication Sig Dispense Refill  . acetaminophen (TYLENOL) 500 MG tablet Take 2 tablets (1,000 mg total) by mouth every 8 (eight) hours as needed. Take 2 tabs every 8 hours. 30 tablet 0  . cetirizine (ZYRTEC) 10 MG tablet Take 1 tablet (10 mg total) by mouth daily. One tab daily for allergies 30 tablet 1  . fluticasone (FLONASE) 50 MCG/ACT nasal spray Place 1 spray into both nostrils daily. 16 g 2  . meloxicam (MOBIC) 7.5 MG tablet Take 7.5 mg by mouth daily.  3  . nortriptyline (PAMELOR) 25 MG capsule Take 1 capsule (25 mg total) by mouth at bedtime. 90 capsule 5  . pantoprazole (PROTONIX) 40 MG tablet Take 1 tablet (40 mg total) by mouth daily. 30 tablet 3  . prochlorperazine (COMPAZINE) 10 MG tablet Take 1 tablet (10 mg total) by mouth 2 (two) times daily as needed (for headache). 10 tablet 0  . simvastatin (ZOCOR) 40 MG tablet Take 1 tablet (40 mg total) by mouth at bedtime. 90 tablet 3  . SUMAtriptan (IMITREX) 50 MG tablet Take 1 tablet (50 mg total) by mouth every 2 (two) hours as needed for migraine. May repeat in 2 hours if headache persists or recurs. 30 tablet 1   No current facility-administered medications for this visit.      Allergies:  Allergies  Allergen Reactions  . Aspirin     Past Surgical History:  Procedure Laterality Date  . BREAST LUMPECTOMY     benign  . COLONOSCOPY N/A 07/12/2014   SLF:  1. small ulcer in teh terminal ileum 2. Small internal hemorrhoids.  . TONSILLECTOMY      Social History   Socioeconomic History  . Marital status: Divorced    Spouse name: Not on file  . Number of children: 0  . Years of education: Not on file  . Highest education level: Not on file  Occupational History  . Occupation: unemployed  Social Needs  . Financial resource strain: Not on file  . Food insecurity:    Worry: Not on file    Inability: Not on file  . Transportation needs:    Medical: Not on file    Non-medical: Not on file  Tobacco Use  . Smoking status: Never Smoker  . Smokeless tobacco: Never Used  Substance and Sexual Activity  . Alcohol use: No    Alcohol/week: 0.0 oz  . Drug use: No  . Sexual activity: Not on file  Lifestyle  . Physical activity:    Days per week: Not on file    Minutes per session: Not on file  . Stress: Not on file  Relationships  . Social  connections:    Talks on phone: Not on file    Gets together: Not on file    Attends religious service: Not on file    Active member of club or organization: Not on file    Attends meetings of clubs or organizations: Not on file    Relationship status: Not on file  Other Topics Concern  . Not on file  Social History Narrative  . Not on file    Family History  Problem Relation Age of Onset  . Hypertension Mother   . Colon cancer Neg Hx      ROS Review of Systems See HPI Constitution: No fevers or chills No malaise No diaphoresis Skin: No rash or itching Eyes: no blurry vision, no double vision GU: no dysuria or hematuria Neuro: no dizziness or headaches  all others reviewed and negative   Objective: Vitals:   11/13/17 1200  BP: 128/75  Pulse: 69  Resp: 17  Temp: 98 F (36.7 C)  TempSrc:  Oral  SpO2: 99%  Weight: 100 lb 9.6 oz (45.6 kg)  Height: 5' 1.5" (1.562 m)    Physical Exam  Constitutional: She is oriented to person, place, and time. She appears well-developed and well-nourished.  HENT:  Head: Normocephalic and atraumatic.  Eyes: Conjunctivae and EOM are normal.  Cardiovascular: Normal rate, regular rhythm and normal heart sounds.  No murmur heard. Pulmonary/Chest: Effort normal and breath sounds normal. No stridor. No respiratory distress. She has no wheezes.  Neurological: She is alert and oriented to person, place, and time.  Skin: Skin is warm. Capillary refill takes less than 2 seconds.  Psychiatric: She has a normal mood and affect. Her behavior is normal. Judgment and thought content normal.    Chaperone present Breast exam - scar next to right nipple from previous lympectomy Breast symmetric, no nipple discharge, no retraction, no skin changes, no palpable masses Dense breast tissue on exam nontender  Assessment and Plan Judith Sherman was seen today for tenderness in breast with sharp pain in the left breast x 4 .  Diagnoses and all orders for this visit:  Breast pain in female -     MM DIAG BREAST TOMO BILATERAL; Future  advised to follow up for breast mammogram diagnostic Pt has number and will call the breast center herself   Dhrithi Riche A Creta Sherman

## 2017-11-13 NOTE — Patient Instructions (Addendum)
IF you received an x-ray today, you will receive an invoice from [facility]. Please contact [facility] at [phone] with questions or concerns regarding your invoice.   IF you received labwork today, you will receive an invoice from [facility]. Please contact [facility] at [phone] with questions or concerns regarding your invoice.   Our billing staff will not be able to assist you with questions regarding bills from these companies.  You will be contacted with the lab results as soon as they are available. The fastest way to get your results is to activate your My Chart account. Instructions are located on the last page of this paperwork. If you have not heard from us regarding the results in 2 weeks, please contact this office.    We recommend that you schedule a mammogram for breast cancer screening. Typically, you do not need a referral to do this. Please contact a local imaging center to schedule your mammogram.  [facility] - [phone]) [phone]  *ask for the Radiology Department [facility]) - [phone]) [phone] or [phone]  [facility] - [phone] [facility] - [phone] [facility] - [phone](336) [phone]  *ask for the Radiology Department [facility] - [phone]) [phone]  *ask for the Radiology Department [facility] - [phone]  *ask for the Mammography Department [facility] - [phone] Ch?p X quang tuy?n v Mammogram Ch?p X quang tuy?n v l ch?p v b?ng X quang ?? ki?m tra nh?ng thay ??i b?t th??ng. Th? thu?t ny c th? sng l?c ho?c pht hi?n b?t k? thay ??i no c th? cho th?y ung th? v. Ch?p X quang tuy?n v c?ng c th? xc ??nh nh?ng thay ??i v bi?n ??i khc ? v, ch?ng h?n nh?:  Vim m v (vim v).  Vng nhi?m trng b? m?ng m? (p-xe).  M?t ti ch?a d?ch (u nang).  Nh?ng thay ??i x? nang tuy?n v. Tnh tr?ng ny l khi m v tr? nn  ??c h?n, c th? s? th?y m v gi?ng nh? s?i dy ho?c l?n nh?n d??i da.  Cc kh?i u khng ph?i ung th? (lnh tnh).  Hy cho chuyn gia ch?m [place] s?c kh?e bi?t v?:  B?t k? v?n ?? d? ?ng no m qu v? c.  N?u qu v? lm ph?u thu?t nng ng?c.  N?u tr??c ?y qu v? ? c b?nh v? v, sinh thi?t ho?c ph?u thu?t.  N?u qu v? ?ang cho con b.  B?t k? kh? n?ng no m qu v? c th? c thai, n?u ?i?u ny p d?ng.  N?u qu v? d??i 25 tu?i.  N?u qu v? c ti?n s? gia ?nh b? ung th? v. Cc nguy c? l g? Ni chung, ?y l m?t th? thu?t an ton. Tuy nhin, cc v?n ?? c th? x?y ra, bao g?m:  Ph?i nhi?m phng x?Marland Kitchen. M?c ?? phng x? l r?t th?p v?i ki?m tra ny.  K?t qu? b? hi?u sai.  C?n ph?i lm thm cc ki?m tra khc.  Ch?p X quang tuy?n v khng th? pht hi?n m?t s? lo?i ung th? nh?t ??nh.  ?i?u g x?y ra tr??c khi lm th? thu?t?  Ln l?ch ki?m tra kho?ng 1-2 tu?n sau k? hnh kinh. ?y th??ng l th?i gian m ng?c c?a qu v? t nh?y c?m ?au nh?t.  N?u qu v? ? t?ng ch?p X quang tuy?n v ? m?t c? s? khc trong qu kh?,  hy l?y phim X quang tuy?n v ho?c g?i phim ??n c? s? khm b?nh hi?n t?i ?? so snh.  Lau r?a ng?c v ph?n d??i cnh tay vo ngy ki?m tra.  Khng bi ch?t kh? mi, n??c hoa, kem d??ng da, ho?c ph?n b?t vo b?t c? ch? no trn c? th? qu v? vo ngy ki?m tra.  Tho b? t?t c? ?? trang s?c ? c?.  M?c lo?i qu?n o m qu v? c th? m?c vo v thay ra d? dng. ?i?u g x?y ra trong qu trnh th?c hi?n th? thu?t?  Qu v? s? c?i qu?n o t? th?t l?ng tr? ln v m?c m?t o chong.  Qu v? s? ??ng tr??c m?t my ch?p X quang.  M?i bn v s? ???c k?p vo gi?a hai t?m nh?a ho?c th?y tinh. Hai t?m ny s? p v c?a qu v? trong vi giy. C? g?ng cng th? gin cng t?t trong khi lm th? thu?t. Th? thu?t ny khng gy ?au cho v c?a qu v? v b?t k? c?m gic kh ch?u no c?ng h?t r?t nhanh.  X quang s? ???c ch?p t? cc gc khc nhau c?a m?i bn v. Th? thu?t ny c th? khc nhau  gi?a cc chuyn gia ch?m [place] s?c kh?e v cc b?nh vi?n. ?i?u g x?y ra sau khi lm th? thu?t?  Phim ch?p X quang tuy?n v s? ???c m?t chuyn gia (bc s? X quang) ki?m tra.  Qu v? c th? c?n l?p l?i m?t s? cng ?o?n ki?m tra nh?t ??nh, ty thu?c vo ch?t l??ng hnh ?nh. Vi?c ny th??ng ???c th?c hi?n n?u bc s? X quang c?n xem hnh ?nh r h?n c?a m v.  Hy h?i khi no s? c k?t qu? ki?m tra. ??m b?o vi?c qu v? nh?n ???c k?t qu? ki?m tra c?a mnh.  Qu v? c th? tr? l?i sinh ho?t bnh th??ng. Thng tin ny khng nh?m m?c ?ch thay th? cho l?i khuyn m chuyn gia ch?m [place] s?c kh?e ni v?i qu v?. Hy b?o ??m qu v? ph?i th?o lu?n b?t k? v?n ?? g m qu v? c v?i chuyn gia ch?m [place] s?c kh?e c?a qu v?. Document Released: 07/09/2005 Document Revised: 10/25/2016 Document Reviewed: 09/17/2014 Elsevier Interactive Patient Education  2018 [employer].

## 2017-11-18 ENCOUNTER — Ambulatory Visit: Payer: BLUE CROSS/BLUE SHIELD

## 2017-11-18 ENCOUNTER — Ambulatory Visit
Admission: RE | Admit: 2017-11-18 | Discharge: 2017-11-18 | Disposition: A | Discharge status: Home / Self Care | Payer: BLUE CROSS/BLUE SHIELD | Source: Ambulatory Visit | Attending: Family Medicine | Admitting: Family Medicine

## 2017-11-18 DIAGNOSIS — N644 Mastodynia: Secondary | ICD-10-CM

## 2017-12-19 ENCOUNTER — Encounter: Payer: Self-pay | Admitting: Family Medicine

## 2017-12-19 ENCOUNTER — Telehealth: Payer: Self-pay | Admitting: Family Medicine

## 2017-12-19 ENCOUNTER — Other Ambulatory Visit: Payer: Self-pay | Admitting: *Deleted

## 2017-12-19 ENCOUNTER — Ambulatory Visit (INDEPENDENT_AMBULATORY_CARE_PROVIDER_SITE_OTHER): Payer: BLUE CROSS/BLUE SHIELD | Admitting: Family Medicine

## 2017-12-19 ENCOUNTER — Other Ambulatory Visit: Payer: Self-pay

## 2017-12-19 VITALS — BP 130/78 | HR 76 | Temp 98.8°F | Resp 17 | Ht 61.5 in | Wt 100.0 lb

## 2017-12-19 DIAGNOSIS — J302 Other seasonal allergic rhinitis: Secondary | ICD-10-CM

## 2017-12-19 DIAGNOSIS — Z0000 Encounter for general adult medical examination without abnormal findings: Secondary | ICD-10-CM | POA: Diagnosis not present

## 2017-12-19 DIAGNOSIS — E785 Hyperlipidemia, unspecified: Secondary | ICD-10-CM

## 2017-12-19 DIAGNOSIS — Z1329 Encounter for screening for other suspected endocrine disorder: Secondary | ICD-10-CM

## 2017-12-19 DIAGNOSIS — E559 Vitamin D deficiency, unspecified: Secondary | ICD-10-CM | POA: Diagnosis not present

## 2017-12-19 DIAGNOSIS — R05 Cough: Secondary | ICD-10-CM

## 2017-12-19 DIAGNOSIS — R739 Hyperglycemia, unspecified: Secondary | ICD-10-CM | POA: Diagnosis not present

## 2017-12-19 DIAGNOSIS — Z13220 Encounter for screening for lipoid disorders: Secondary | ICD-10-CM | POA: Diagnosis not present

## 2017-12-19 DIAGNOSIS — R062 Wheezing: Secondary | ICD-10-CM | POA: Diagnosis not present

## 2017-12-19 DIAGNOSIS — R059 Cough, unspecified: Secondary | ICD-10-CM

## 2017-12-19 MED ORDER — FLUTICASONE PROPIONATE 50 MCG/ACT NA SUSP: 1.0000 | Freq: Every day | NASAL | 2 refills | Status: DC

## 2017-12-19 MED ORDER — AZITHROMYCIN 250 MG PO TABS: ORAL_TABLET | ORAL | 0 refills | Status: DC

## 2017-12-19 MED ORDER — SIMVASTATIN 40 MG PO TABS: 40.0000 mg | ORAL_TABLET | Freq: Every day | ORAL | 3 refills | Status: AC

## 2017-12-19 NOTE — Patient Instructions (Addendum)
TRY OVER THE COUNTER MUCINEX  IF you received an x-ray today, you will receive an invoice from [facility]. Please contact [facility] at [phone] with questions or concerns regarding your invoice.   IF you received labwork today, you will receive an invoice from [facility]. Please contact [facility] at [phone] with questions or concerns regarding your invoice.   Our billing staff will not be able to assist you with questions regarding bills from these companies.  You will be contacted with the lab results as soon as they are available. The fastest way to get your results is to activate your My Chart account. Instructions are located on the last page of this paperwork. If you have not heard from [place] regarding the results in 2 weeks, please contact this office.     Vim ph? qu?n c?p tnh, Ng??i l?n Acute Bronchitis, Adult Vim ph? qu?n c?p tnh l tnh tr?ng s?ng ??t ng?t (c?p tnh) cc ?ng d?n kh (ph? qu?n) trong ph?i. Vim ph? qu?n c?p tnh lm cho cc ?ng ny ??y d?ch nh?y, ?i?u ny c th? d?n ??n kh th?. B?nh c?ng c th? gy ho ho?c th? kh kh. ? ng??i l?n, vim ph? qu?n c?p tnh th??ng kh?i trong vng 2 tu?n. Ho do vim ph? qu?n c th? ko di ??n 3 tu?n. Ht thu?c, d? ?ng, v hen suy?n c th? lm cho tnh tr?ng ny tr?m tr?ng h?n. Nh?ng ??t vim ph? qu?n l?p ?i l?p l?i c th? gy thm cc v?n ?? ? ph?i, ch?ng h?n nh? b?nh ph?i t?c ngh?n m?n tnh (COPD). Nguyn nhn g gy ra? Tnh tr?ng ny c th? do cc m?m b?nh v cc ch?t gy kch ?ng ph?i gy ra, bao g?m:  Vi rt gy c?m l?nh v cm. Tnh tr?ng ny ph?n l?n th??ng do cng m?t lo?i vi rt gy c?m l?nh gy ra.  Vi khu?n.  Ti?p xc v?i khi thu?c l, b?i, khi v  nhi?m khng kh.  ?i?u g lm t?ng nguy c?? Tnh tr?ng ny hay x?y ra h?n ? nh?ng ng??i:  Ti?p xc g?n g?i v?i ai ? b? vim ph? qu?n c?p tnh.  Ti?p xc v?i nh?ng ch?t gy kch ?ng ph?i, ch?ng h?n nh? khi thu?c l, b?i, khi v h?i  n??c.  C h? mi?n d?ch y?u.  C tnh tr?ng b?nh l v? h h?p nh? hen suy?n.  Cc d?u hi?u ho?c tri?u ch?ng l g? Nh?ng tri?u ch?ng c?a tnh tr?ng ny bao g?m:  Ho.  Ho ra d?ch nh?y trong, mu vng ho?c mu xanh l cy.  Th? kh kh.  T?c ng?c.  Kh th?.  S?t.  ?au nh?c c? th?.  ?n l?nh.  ?au h?ng.  Ch?n ?on tnh tr?ng ny nh? th? no? Tnh tr?ng ny th???ng ???c ch?n ?on b?ng cch khm th?c th?Suzzette Righter qu trnh Loren Racer, chuyn gia ch?m [place] s?c kh?e c?a qu v? c th? yu c?u cc xt nghi?m, ch?ng h?n nh? ch?p X-quang ng?c, ?? lo?i b? cc tnh tr?ng khc. Chuyn gia c?ng c th?:  Xt nghi?m m?u d?ch nh?y ?? xem c b? nhi?m vi khu?n khng.  Ki?m tra n?ng ?? oxi trong mu. Vi?c ny ???c th?c hi?n ?? xem tr? c b? vim ph?i khng.  Ch?p X-quang ng?c ho?c ki?m tra ch?c n?ng ph?i ?? lo?i tr? vim ph?i v nh?ng tnh tr?ng khc.  Xt nghi?m mu.  Chuyn gia ch?m  s?c kh?e c?a qu v? c?ng s? h?i v? cc tri?u  ch?ng v b?nh s? c?a qu v?. Tnh tr?ng ny ???c ?i?u tr? nh? th? no? H?u h?t cc tr??ng h?p vim ph? qu?n c?p tnh ??u kh?i theo th?i gian m khng c?n ?i?u tr?Shaune Pascal gia ch?m [place] s?c kh?e c?a qu v? c th? khuy?n ngh?:  U?ng nhi?u n??c h?n. U?ng nhi?u n??c h?n gip d?ch nh?y long h?n, c th? lm cho qu v? th? d? dng h?n.  U?ng thu?c gi?m s?t ho?c gi?m ho.  U?ng thu?c khng sinh.  S? d?ng my kh dung ?? c?i thi?n tnh tr?ng kh th? v ki?m sot ho.  S? d?ng my phun h?i n??c mt ho?c my ?i?u ?m ?? lm cho qu v? th? d? dng h?n.  Tun th? nh?ng h??ng d?n ny ? nh: Thu?c  Ch? s? d?ng thu?c khng k ??n v thu?c k ??n theo ch? d?n c?a chuyn gia ch?m [place] s?c kh?e.  N?u qu v? ???c k thu?c khng sinh, hy dng thu?c theo ch? d?n c?a chuyn gia ch?m [place] s?c kh?e. Khng d?ng u?ng thu?c khng sinh ngay c? khi qu v? b?t ??u c?m th?y ?? h?n. H??ng d?n chung  Ngh? ng?i th?t nhi?u.  U?ng ?? n??c ?? gi? cho n??c ti?u trong ho?c vng nh?t.  Trnh ht  thu?c l v khi thu?c l th? ??ng. Ti?p xc v?i khi thu?c l ho?c cc ha ch?t kch thch s? lm cho vim ph? qu?n n?ng h?n. N?u qu v? ht thu?c v c?n gip ?? ?? b? thu?c l, hy h?i chuyn gia ch?m [place] s?c kh?e. B? ht thu?c s? gip ph?i bnh ph?c nhanh h?n.  S? d?ng my kh dung, my phun h?i n??c mt ho?c my ?i?u ?m theo ch? d?n c?a chuyn gia ch?m [place] s?c kh?e.  Tun th? t?t c? cc l?n khm theo di theo ch? d?n c?a chuyn gia ch?m [place] s?c kh?e. ?i?u ny c vai tr quan tr?ng. Ng?n ng?a tnh tr?ng ny b?ng cch no? ?? gi?m nguy c? m?c l?i tnh tr?ng ny:  R?a tay th???ng xuyn b?ng x phng v n??c. N?u khng c x phng v n??c, hy dng thu?c st trng tay.  Trnh ti?p xc v?i nh?ng ng??i c tri?u ch?ng c?m l?nh.  C? g?ng khng ch?m tay ln mi?ng, m?i, ho?c m?t.  ??m b?o tim phng cm hng n?m.  Hy lin l?c v?i chuyn gia ch?m [place] s?c kh?e n?u:  Cc tri?u ch?ng c?a qu v? khng c?i thi?n sau 2 tu?n ?i?u tr?Cathie Hoops c?u tr? gip ngay l?p t?c n?u:  Qu v? ho ra mu.  Qu v? b? ?au ng?c.  Qu v? b? kh th? r?t nhi?u.  Qu v? b? m?t n??c.  Qu v? ng?t x?u ho?c qu v? c?m th?y nh? l s?p ng?t x?u.  Qu v? ti?p t?c nn m?a.  Qu v? b? ?au ??u nhi?u.  Tnh tr?ng s?t ho?c ?n l?nh c?a qu v? tr?m tr?ng h?n. Thng tin ny khng nh?m m?c ?ch thay th? cho l?i khuyn m chuyn gia ch?m [place] s?c kh?e ni v?i qu v?. Hy b?o ??m qu v? ph?i th?o lu?n b?t k? v?n ?? g m qu v? c v?i chuyn gia ch?m [place] s?c kh?e c?a qu v?. Document Released: 08/05/2015 Document Revised: 10/12/2016 Document Reviewed: 12/28/2015 Elsevier Interactive Patient Education  2018 [employer].

## 2017-12-19 NOTE — Progress Notes (Signed)
Chief Complaint  Patient presents with  . Annual Exam    cpe no pap, last pap 02/23/16, cough/sore throat x 2 weeks-taking severe congestion and cough and flu and sever cold and cough with no relief.  Pt would like azithromycin as it worked well for her the last time it was given,Cough and sore throat  with yellow/greenish-pinky smelly phlegm, wants antibiotic and is going on trip tomorrow.      Subjective:  Judith Sherman is a 62 y.o. female here for a health maintenance visit.  Patient is established pt   Upper Respiratory Infection: Patient complains of symptoms of a URI. Symptoms include cough. Onset of symptoms was 2 weeks ago, unchanged since that time. She also c/o productive cough with  gray, yellow and green colored sputum for the past 2 weeks .  She is drinking plenty of fluids. Evaluation to date: none. Treatment to date: decongestants.     Patient Active Problem List   Diagnosis Date Noted  . Hypovitaminosis D 10/03/2016  . Tooth decay 06/07/2016  . Language barrier 02/24/2016  . Vaginal dryness 02/24/2016  . Migraine without aura and responsive to treatment 12/14/2015  . Other seasonal allergic rhinitis 12/14/2015  . Degenerative disc disease, lumbar 05/20/2015  . Hyperlipidemia 05/20/2015  . Loss of weight 05/06/2014  . Gastroesophageal reflux disease without esophagitis 05/06/2014  . Dyslipidemia 05/06/2014  . Annual physical exam 01/18/2014  . Establishing care with new doctor, encounter for 01/12/2014    Past Medical History:  Diagnosis Date  . Allergic rhinitis   . Hyperlipidemia   . Hypertension   . LPRD (laryngopharyngeal reflux disease)    Evaluated by [facility]    Past Surgical History:  Procedure Laterality Date  . BREAST LUMPECTOMY     benign  . COLONOSCOPY N/A 07/12/2014   SLF:  1. small ulcer in teh terminal ileum 2. Small internal hemorrhoids.  . TONSILLECTOMY       Outpatient Medications Prior to Visit    Medication Sig Dispense Refill  . acetaminophen (TYLENOL) 500 MG tablet Take 2 tablets (1,000 mg total) by mouth every 8 (eight) hours as needed. Take 2 tabs every 8 hours. 30 tablet 0  . cetirizine (ZYRTEC) 10 MG tablet Take 1 tablet (10 mg total) by mouth daily. One tab daily for allergies 30 tablet 1  . meloxicam (MOBIC) 7.5 MG tablet Take 7.5 mg by mouth daily.  3  . nortriptyline (PAMELOR) 25 MG capsule Take 1 capsule (25 mg total) by mouth at bedtime. 90 capsule 5  . pantoprazole (PROTONIX) 40 MG tablet Take 1 tablet (40 mg total) by mouth daily. 30 tablet 3  . prochlorperazine (COMPAZINE) 10 MG tablet Take 1 tablet (10 mg total) by mouth 2 (two) times daily as needed (for headache). 10 tablet 0  . SUMAtriptan (IMITREX) 50 MG tablet Take 1 tablet (50 mg total) by mouth every 2 (two) hours as needed for migraine. May repeat in 2 hours if headache persists or recurs. 30 tablet 1  . fluticasone (FLONASE) 50 MCG/ACT nasal spray Place 1 spray into both nostrils daily. 16 g 2  . simvastatin (ZOCOR) 40 MG tablet Take 1 tablet (40 mg total) by mouth at bedtime. 90 tablet 3   No facility-administered medications prior to visit.     Allergies  Allergen Reactions  . Aspirin      Family History  Problem Relation Age of Onset  . Hypertension Mother   . Colon cancer Neg Hx  Social History   Socioeconomic History  . Marital status: Married    Spouse name: Not on file  . Number of children: 0  . Years of education: Not on file  . Highest education level: Not on file  Occupational History  . Occupation: unemployed  Social Needs  . Financial resource strain: Not on file  . Food insecurity:    Worry: Not on file    Inability: Not on file  . Transportation needs:    Medical: Not on file    Non-medical: Not on file  Tobacco Use  . Smoking status: Never Smoker  . Smokeless tobacco: Never Used  Substance and Sexual Activity  . Alcohol use: No    Alcohol/week: 0.0 oz  .  Drug use: No  . Sexual activity: Not on file  Lifestyle  . Physical activity:    Days per week: Not on file    Minutes per session: Not on file  . Stress: Not on file  Relationships  . Social connections:    Talks on phone: Not on file    Gets together: Not on file    Attends religious service: Not on file    Active member of club or organization: Not on file    Attends meetings of clubs or organizations: Not on file    Relationship status: Not on file  . Intimate partner violence:    Fear of current or ex partner: Not on file    Emotionally abused: Not on file    Physically abused: Not on file    Forced sexual activity: Not on file  Other Topics Concern  . Not on file  Social History Narrative  . Not on file   Social History   Substance and Sexual Activity  Alcohol Use No  . Alcohol/week: 0.0 oz   Social History   Tobacco Use  Smoking Status Never Smoker  Smokeless Tobacco Never Used   Social History   Substance and Sexual Activity  Drug Use No    GYN: Sexual Health Menstrual status: regular menses LMP: No LMP recorded. Patient is postmenopausal. Last pap smear: see HM section.  Last pap up to date Health Maintenance: See under health Maintenance activity for review of completion dates as well. Immunization History  Administered Date(s) Administered  . Influenza,inj,Quad PF,6+ Mos 05/20/2015, 06/07/2016  . PPD Test 01/13/2014      Depression Screen-PHQ2/9 Depression screen [facility] 2/9 12/19/2017 11/13/2017 04/30/2017 06/07/2016 01/17/2016  Decreased Interest 0 0 0 0 0  Down, Depressed, Hopeless 0 0 0 0 0  PHQ - 2 Score 0 0 0 0 0  Altered sleeping - - - - -  Tired, decreased energy - - - - -  Change in appetite - - - - -  Feeling bad or failure about yourself  - - - - -  Trouble concentrating - - - - -  Moving slowly or fidgety/restless - - - - -  Suicidal thoughts - - - - -  PHQ-9 Score - - - - -  Difficult doing work/chores - - - - -        Depression Severity and Treatment Recommendations:  0-4= None  5-9= Mild / Treatment: Support, educate to call if worse; return in one month  10-14= Moderate / Treatment: Support, watchful waiting; Antidepressant or Psycotherapy  15-19= Moderately severe / Treatment: Antidepressant OR Psychotherapy  >= 20 = Major depression, severe / Antidepressant AND Psychotherapy    Review of Systems   Review  of Systems  Constitutional: Negative for chills and fever.  HENT: Negative for congestion and sore throat.   Respiratory: Positive for cough, sputum production and wheezing. Negative for hemoptysis and shortness of breath.   Cardiovascular: Negative for chest pain, palpitations, orthopnea, claudication and leg swelling.  Gastrointestinal: Negative for abdominal pain, nausea and vomiting.  Genitourinary: Negative for dysuria and urgency.  Musculoskeletal: Negative for back pain, myalgias and neck pain.  Skin: Negative for itching and rash.  Neurological: Negative for dizziness, tingling, tremors and headaches.  Psychiatric/Behavioral: Negative for depression. The patient is not nervous/anxious and does not have insomnia.     See HPI for ROS as well.    Objective:   Vitals:   12/19/17 0827  BP: 130/78  Pulse: 76  Resp: 17  Temp: 98.8 F (37.1 C)  TempSrc: Oral  SpO2: 98%  Weight: 100 lb (45.4 kg)  Height: 5' 1.5" (1.562 m)   Wt Readings from Last 3 Encounters:  12/19/17 100 lb (45.4 kg)  11/13/17 100 lb 9.6 oz (45.6 kg)  04/30/17 107 lb (48.5 kg)    Body mass index is 18.59 kg/m.  Physical Exam  BP 130/78 (BP Location: Right Arm, Patient Position: Sitting, Cuff Size: Normal)   Pulse 76   Temp 98.8 F (37.1 C) (Oral)   Resp 17   Ht 5' 1.5" (1.562 m)   Wt 100 lb (45.4 kg)   SpO2 98%   BMI 18.59 kg/m   General Appearance:    Alert, cooperative, no distress, appears stated age  Head:    Normocephalic, without obvious abnormality, atraumatic  Eyes:     PERRL, conjunctiva/corneas clear, EOM's intact, fundi    benign, both eyes  Ears:    Normal TM's and external ear canals, both ears  Nose:   Nares normal, septum midline, mucosa normal, no drainage    or sinus tenderness  Throat:   Lips, mucosa, and tongue normal; teeth and gums normal  Neck:   Supple, symmetrical, trachea midline, no adenopathy;    thyroid:  no enlargement/tenderness/nodules; no carotid   bruit or JVD  Back:     Symmetric, no curvature, ROM normal, no CVA tenderness  Lungs:     respirations unlabored, wheezing noted bilaterally   Chest Wall:    No tenderness or deformity   Heart:    Regular rate and rhythm, S1 and S2 normal, no murmur, rub   or gallop  Breast Exam:    No tenderness, masses, or nipple abnormality  Abdomen:     Soft, non-tender, bowel sounds active all four quadrants,    no masses, no organomegaly  Extremities:   Extremities normal, atraumatic, no cyanosis or edema  Pulses:   2+ and symmetric all extremities  Skin:   Skin color, texture, turgor normal, no rashes or lesions  Lymph nodes:   Cervical, supraclavicular, and axillary nodes normal  Neurologic:   CNII-XII intact, normal strength, sensation and reflexes    throughout        Assessment/Plan:   Patient was seen for a health maintenance exam.  Counseled the patient on health maintenance issues. Reviewed her health mainteance schedule and ordered appropriate tests (see orders.) Counseled on regular exercise and weight management. Recommend regular eye exams and dental cleaning.   The following issues were addressed today for health maintenance:   Judith Sherman was seen today for annual exam.  Diagnoses and all orders for this visit:  Encounter for health maintenance examination in adult [facility] Health Maintenance Plan  Advised monthly breast exam and annual mammogram Advised dental exam every six months Discussed stress management Discussed pap smear screening guidelines   Cough Wheezing -    Likely acute bronchitis -     CBC - will prescribe zpak Continue flonase mucinex for cough  Screening, lipid -     Lipid panel -     Comprehensive metabolic panel  Vitamin D insufficiency -     Comprehensive metabolic panel -     VITAMIN D 25 Hydroxy (Vit-D Deficiency, Fractures)  Hyperglycemia -     Hemoglobin A1c -     Comprehensive metabolic panel  Screening for thyroid disorder -     TSH  Other seasonal allergic rhinitis -     fluticasone (FLONASE) 50 MCG/ACT nasal spray; Place 1 spray into both nostrils daily.  Dyslipidemia -     simvastatin (ZOCOR) 40 MG tablet; Take 1 tablet (40 mg total) by mouth at bedtime.  Other orders -     azithromycin (ZITHROMAX) 250 MG tablet; Take 2 tablets on day one then 1 each day after    Return in about 1 year (around 12/20/2018) for physical.    Body mass index is 18.59 kg/m.:  Discussed the patient's BMI with patient. The BMI body mass index is 18.59 kg/m.     No future appointments.  Patient Instructions     TRY OVER THE COUNTER MUCINEX  IF you received an x-ray today, you will receive an invoice from [facility]. Please contact [facility] at [phone] with questions or concerns regarding your invoice.   IF you received labwork today, you will receive an invoice from [place]. Please contact [facility] at [phone] with questions or concerns regarding your invoice.   Our billing staff will not be able to assist you with questions regarding bills from these companies.  You will be contacted with the lab results as soon as they are available. The fastest way to get your results is to activate your My Chart account. Instructions are located on the last page of this paperwork. If you have not heard from [place] regarding the results in 2 weeks, please contact this office.     Vim ph? qu?n c?p tnh, Ng??i l?n Acute Bronchitis, Adult Vim ph? qu?n c?p tnh l tnh tr?ng s?ng ??t ng?t (c?p tnh) cc ?ng  d?n kh (ph? qu?n) trong ph?i. Vim ph? qu?n c?p tnh lm cho cc ?ng ny ??y d?ch nh?y, ?i?u ny c th? d?n ??n kh th?. B?nh c?ng c th? gy ho ho?c th? kh kh. ? ng??i l?n, vim ph? qu?n c?p tnh th??ng kh?i trong vng 2 tu?n. Ho do vim ph? qu?n c th? ko di ??n 3 tu?n. Ht thu?c, d? ?ng, v hen suy?n c th? lm cho tnh tr?ng ny tr?m tr?ng h?n. Nh?ng ??t vim ph? qu?n l?p ?i l?p l?i c th? gy thm cc v?n ?? ? ph?i, ch?ng h?n nh? b?nh ph?i t?c ngh?n m?n tnh (COPD). Nguyn nhn g gy ra? Tnh tr?ng ny c th? do cc m?m b?nh v cc ch?t gy kch ?ng ph?i gy ra, bao g?m:  Vi rt gy c?m l?nh v cm. Tnh tr?ng ny ph?n l?n th??ng do cng m?t lo?i vi rt gy c?m l?nh gy ra.  Vi khu?n.  Ti?p xc v?i khi thu?c l, b?i, khi v  nhi?m khng kh.  ?i?u g lm t?ng nguy c?? Tnh tr?ng ny hay x?y ra h?n ? nh?ng ng??i:  Ti?p xc g?n g?i v?i ai ?  b? vim ph? qu?n c?p tnh.  Ti?p xc v?i nh?ng ch?t gy kch ?ng ph?i, ch?ng h?n nh? khi thu?c l, b?i, khi v h?i n??c.  C h? mi?n d?ch y?u.  C tnh tr?ng b?nh l v? h h?p nh? hen suy?n.  Cc d?u hi?u ho?c tri?u ch?ng l g? Nh?ng tri?u ch?ng c?a tnh tr?ng ny bao g?m:  Ho.  Ho ra d?ch nh?y trong, mu vng ho?c mu xanh l cy.  Th? kh kh.  T?c ng?c.  Kh th?.  S?t.  ?au nh?c c? th?.  ?n l?nh.  ?au h?ng.  Ch?n ?on tnh tr?ng ny nh? th? no? Tnh tr?ng ny th???ng ???c ch?n ?on b?ng cch khm th?c th?Suzzette Righter qu trnh Loren Racer, chuyn gia ch?m [place] s?c kh?e c?a qu v? c th? yu c?u cc xt nghi?m, ch?ng h?n nh? ch?p [place] ng?c, ?? lo?i b? cc tnh tr?ng khc. Chuyn gia c?ng c th?:  Xt nghi?m m?u d?ch nh?y ?? xem c b? nhi?m vi khu?n khng.  Ki?m tra n?ng ?? oxi trong mu. Vi?c ny ???c th?c hi?n ?? xem tr? c b? vim ph?i khng.  Ch?p X-quang ng?c ho?c ki?m tra ch?c n?ng ph?i ?? lo?i tr? vim ph?i v nh?ng tnh tr?ng khc.  Xt nghi?m mu.  Chuyn gia ch?m [place] s?c kh?e c?a qu v? c?ng s? h?i v? cc  tri?u ch?ng v b?nh s? c?a qu v?. Tnh tr?ng ny ???c ?i?u tr? nh? th? no? H?u h?t cc tr??ng h?p vim ph? qu?n c?p tnh ??u kh?i theo th?i gian m khng c?n ?i?u tr?Shaune Pascal gia ch?m [place] s?c kh?e c?a qu v? c th? khuy?n ngh?:  U?ng nhi?u n??c h?n. U?ng nhi?u n??c h?n gip d?ch nh?y long h?n, c th? lm cho qu v? th? d? dng h?n.  U?ng thu?c gi?m s?t ho?c gi?m ho.  U?ng thu?c khng sinh.  S? d?ng my kh dung ?? c?i thi?n tnh tr?ng kh th? v ki?m sot ho.  S? d?ng my phun h?i n??c mt ho?c my ?i?u ?m ?? lm cho qu v? th? d? dng h?n.  Tun th? nh?ng h??ng d?n ny ? nh: Thu?c  Ch? s? d?ng thu?c khng k ??n v thu?c k ??n theo ch? d?n c?a chuyn gia ch?m [place] s?c kh?e.  N?u qu v? ???c k thu?c khng sinh, hy dng thu?c theo ch? d?n c?a chuyn gia ch?m [place] s?c kh?e. Khng d?ng u?ng thu?c khng sinh ngay c? khi qu v? b?t ??u c?m th?y ?? h?n. H??ng d?n chung  Ngh? ng?i th?t nhi?u.  U?ng ?? n??c ?? gi? cho n??c ti?u trong ho?c vng nh?t.  Trnh ht thu?c l v khi thu?c l th? ??ng. Ti?p xc v?i khi thu?c l ho?c cc ha ch?t kch thch s? lm cho vim ph? qu?n n?ng h?n. N?u qu v? ht thu?c v c?n gip ?? ?? b? thu?c l, hy h?i chuyn gia ch?m [place] s?c kh?e. B? ht thu?c s? gip ph?i bnh ph?c nhanh h?n.  S? d?ng my kh dung, my phun h?i n??c mt ho?c my ?i?u ?m theo ch? d?n c?a chuyn gia ch?m [place] s?c kh?e.  Tun th? t?t c? cc l?n khm theo di theo ch? d?n c?a chuyn gia ch?m  s?c kh?e. ?i?u ny c vai tr quan tr?ng. Ng?n ng?a tnh tr?ng ny b?ng cch no? ?? gi?m nguy c? m?c l?i tnh tr?ng ny:  R?a tay th???ng xuyn b?ng x phng v n??c. N?u khng c x phng v n??c,  hy dng thu?c st trng tay.  Trnh ti?p xc v?i nh?ng ng??i c tri?u ch?ng c?m l?nh.  C? g?ng khng ch?m tay ln mi?ng, m?i, ho?c m?t.  ??m b?o tim phng cm hng n?m.  Hy lin l?c v?i chuyn gia ch?m [place] s?c kh?e n?u:  Cc tri?u ch?ng c?a qu v? khng c?i thi?n sau 2 tu?n ?i?u  tr?Cathie Hoops c?u tr? gip ngay l?p t?c n?u:  Qu v? ho ra mu.  Qu v? b? ?au ng?c.  Qu v? b? kh th? r?t nhi?u.  Qu v? b? m?t n??c.  Qu v? ng?t x?u ho?c qu v? c?m th?y nh? l s?p ng?t x?u.  Qu v? ti?p t?c nn m?a.  Qu v? b? ?au ??u nhi?u.  Tnh tr?ng s?t ho?c ?n l?nh c?a qu v? tr?m tr?ng h?n. Thng tin ny khng nh?m m?c ?ch thay th? cho l?i khuyn m chuyn gia ch?m [place] s?c kh?e ni v?i qu v?. Hy b?o ??m qu v? ph?i th?o lu?n b?t k? v?n ?? g m qu v? c v?i chuyn gia ch?m [place] s?c kh?e c?a qu v?. Document Released: 08/05/2015 Document Revised: 10/12/2016 Document Reviewed: 12/28/2015 Elsevier Interactive Patient Education  2018 [employer].

## 2017-12-19 NOTE — Telephone Encounter (Signed)
Pt request that we mail copiesof her labs from  05.30.2019 visit once they are resulted FR

## 2017-12-20 LAB — LIPID PANEL
Chol/HDL Ratio: 2.6 ratio (ref 0.0–4.4)
Cholesterol, Total: 176 mg/dL (ref 100–199)
HDL: 67 mg/dL (ref >39)
LDL CALC: 92 mg/dL (ref 0–99)
Triglycerides: 85 mg/dL (ref 0–149)
VLDL CHOLESTEROL CAL: 17 mg/dL (ref 5–40)

## 2017-12-20 LAB — COMPREHENSIVE METABOLIC PANEL
ALK PHOS: 69 IU/L (ref 39–117)
ALT: 16 IU/L (ref 0–32)
AST: 20 IU/L (ref 0–40)
Albumin/Globulin Ratio: 1.5 (ref 1.2–2.2)
Albumin: 4.4 g/dL (ref 3.6–4.8)
BILIRUBIN TOTAL: 0.8 mg/dL (ref 0.0–1.2)
BUN/Creatinine Ratio: 20 (ref 12–28)
BUN: 13 mg/dL (ref 8–27)
CHLORIDE: 104 mmol/L (ref 96–106)
CO2: 25 mmol/L (ref 20–29)
Calcium: 9.3 mg/dL (ref 8.7–10.3)
Creatinine, Ser: 0.66 mg/dL (ref 0.57–1.00)
GFR calc Af Amer: 109 mL/min/{1.73_m2} (ref >59)
GFR calc non Af Amer: 95 mL/min/{1.73_m2} (ref >59)
GLUCOSE: 78 mg/dL (ref 65–99)
Globulin, Total: 3 g/dL (ref 1.5–4.5)
Potassium: 3.9 mmol/L (ref 3.5–5.2)
Sodium: 143 mmol/L (ref 134–144)
Total Protein: 7.4 g/dL (ref 6.0–8.5)

## 2017-12-20 LAB — VITAMIN D 25 HYDROXY (VIT D DEFICIENCY, FRACTURES): VIT D 25 HYDROXY: 21.7 ng/mL — AB (ref 30.0–100.0)

## 2017-12-20 LAB — CBC
Hematocrit: 39.9 % (ref 34.0–46.6)
Hemoglobin: 13.4 g/dL (ref 11.1–15.9)
MCH: 30.7 pg (ref 26.6–33.0)
MCHC: 33.6 g/dL (ref 31.5–35.7)
MCV: 91 fL (ref 79–97)
PLATELETS: 291 10*3/uL (ref 150–450)
RBC: 4.37 x10E6/uL (ref 3.77–5.28)
RDW: 13.1 % (ref 12.3–15.4)
WBC: 6.4 10*3/uL (ref 3.4–10.8)

## 2017-12-20 LAB — HEMOGLOBIN A1C
Est. average glucose Bld gHb Est-mCnc: 117 mg/dL
HEMOGLOBIN A1C: 5.7 % — AB (ref 4.8–5.6)

## 2017-12-20 LAB — TSH: TSH: 2.09 u[IU]/mL (ref 0.450–4.500)

## 2018-01-14 ENCOUNTER — Other Ambulatory Visit: Payer: Self-pay

## 2018-01-14 ENCOUNTER — Ambulatory Visit: Payer: BLUE CROSS/BLUE SHIELD | Admitting: Physician Assistant

## 2018-01-14 ENCOUNTER — Encounter: Payer: Self-pay | Admitting: Physician Assistant

## 2018-01-14 VITALS — BP 104/62 | HR 76 | Temp 98.0°F | Resp 18 | Ht 61.5 in | Wt 99.4 lb

## 2018-01-14 DIAGNOSIS — R0982 Postnasal drip: Secondary | ICD-10-CM | POA: Diagnosis not present

## 2018-01-14 DIAGNOSIS — J392 Other diseases of pharynx: Secondary | ICD-10-CM

## 2018-01-14 NOTE — Patient Instructions (Addendum)
  Try mucinex to help with thick mucus.  It is in a blue box and it is 2x/day medication  Stop the flonase nose spray to see how much it is helping you - if when you stop it and you get more congested restart this medication - if when you stop it the symptoms stay the same you do not have to continue it  Delsym over the counter can use be used for cough  Nasal saline which is salt water can be used to get your nose moist.  Increase your water intake will also help.       IF you received an x-ray today, you will receive an invoice from [facility]. Please contact [facility] at [phone] with questions or concerns regarding your invoice.   IF you received labwork today, you will receive an invoice from [facility]. Please contact [facility] at [phone] with questions or concerns regarding your invoice.   Our billing staff will not be able to assist you with questions regarding bills from these companies.  You will be contacted with the lab results as soon as they are available. The fastest way to get your results is to activate your My Chart account. Instructions are located on the last page of this paperwork. If you have not heard from us regarding the results in 2 weeks, please contact this office.

## 2018-01-14 NOTE — Progress Notes (Signed)
Judith Sherman  MRN: [id number] DOB: 1955-09-18  PCP: Morrell Riddle, PA-C  Chief Complaint  Patient presents with  . Sore Throat    very dry x3weeks was treated for this but came back   . Cough    mucus in throat     Subjective:  Pt presents to clinic for dry throat and sometimes a very dark hard mucus.  She has had this for about 4 months and she gets medications but then it gets better and then it comes back.  It has been back for about 3 weeks.  She has nasal congestion with rhinorhea that is clear or white. She clears her throat all the time.  She took the zithromax and she is currently taking the flonase and that helps but her throat still feels dry, though it is better since starting the Flonase.  She does not feel like she has problems with heartburn, though she has been on PPIs in the past she is not on currently.  She has been in [place] 3 years.  No change in living arrangements.  History is obtained by patient with personal interpretor.  Review of Systems  Constitutional: Negative for chills and fever.  HENT: Positive for congestion, postnasal drip (all the time), rhinorrhea (clear/white) and sore throat (dry).   Respiratory: Positive for cough. Negative for chest tightness.   Gastrointestinal: Negative.  Negative for nausea and vomiting.    Patient Active Problem List   Diagnosis Date Noted  . Hypovitaminosis D 10/03/2016  . Tooth decay 06/07/2016  . Language barrier 02/24/2016  . Vaginal dryness 02/24/2016  . Migraine without aura and responsive to treatment 12/14/2015  . Other seasonal allergic rhinitis 12/14/2015  . Degenerative disc disease, lumbar 05/20/2015  . Hyperlipidemia 05/20/2015  . Loss of weight 05/06/2014  . Gastroesophageal reflux disease without esophagitis 05/06/2014  . Dyslipidemia 05/06/2014  . Annual physical exam 01/18/2014  . Establishing care with new doctor, encounter for 01/12/2014    Current Outpatient Medications on File Prior to  Visit  Medication Sig Dispense Refill  . acetaminophen (TYLENOL) 500 MG tablet Take 2 tablets (1,000 mg total) by mouth every 8 (eight) hours as needed. Take 2 tabs every 8 hours. 30 tablet 0  . cetirizine (ZYRTEC) 10 MG tablet Take 1 tablet (10 mg total) by mouth daily. One tab daily for allergies 30 tablet 1  . fluticasone (FLONASE) 50 MCG/ACT nasal spray Place 1 spray into both nostrils daily. 16 g 2  . prochlorperazine (COMPAZINE) 10 MG tablet Take 1 tablet (10 mg total) by mouth 2 (two) times daily as needed (for headache). 10 tablet 0  . simvastatin (ZOCOR) 40 MG tablet Take 1 tablet (40 mg total) by mouth at bedtime. 90 tablet 3  . SUMAtriptan (IMITREX) 50 MG tablet Take 1 tablet (50 mg total) by mouth every 2 (two) hours as needed for migraine. May repeat in 2 hours if headache persists or recurs. 30 tablet 1   No current facility-administered medications on file prior to visit.     Allergies  Allergen Reactions  . Aspirin     Past Medical History:  Diagnosis Date  . Allergic rhinitis   . Hyperlipidemia   . Hypertension   . LPRD (laryngopharyngeal reflux disease)    [facility] by [facility]   Social History   Social History Narrative  . Not on file   Social History   Tobacco Use  . Smoking status: Never Smoker  . Smokeless  tobacco: Never Used  Substance Use Topics  . Alcohol use: No    Alcohol/week: 0.0 oz  . Drug use: No   family history includes Hypertension in her mother.     Objective:  BP 104/62   Pulse 76   Temp 98 F (36.7 C) (Oral)   Resp 18   Ht 5' 1.5" (1.562 m)   Wt 99 lb 6.4 oz (45.1 kg)   SpO2 99%   BMI 18.48 kg/m  Body mass index is 18.48 kg/m.  Wt Readings from Last 3 Encounters:  01/14/18 99 lb 6.4 oz (45.1 kg)  12/19/17 100 lb (45.4 kg)  11/13/17 100 lb 9.6 oz (45.6 kg)    Physical Exam  Constitutional: She is oriented to person, place, and time. She appears well-developed and well-nourished.  HENT:    Head: Normocephalic and atraumatic.  Right Ear: Hearing, tympanic membrane, external ear and ear canal normal.  Left Ear: Hearing, tympanic membrane, external ear and ear canal normal.  Nose: Nose normal.  Mouth/Throat: Uvula is midline, oropharynx is clear and moist and mucous membranes are normal.  Eyes: Conjunctivae are normal.  Neck: Normal range of motion.  Cardiovascular: Normal rate, regular rhythm and normal heart sounds.  No murmur heard. Pulmonary/Chest: Effort normal and breath sounds normal. She has no wheezes.  Lymphadenopathy:       Head (right side): No tonsillar, no preauricular, no posterior auricular and no occipital adenopathy present.       Head (left side): No tonsillar, no preauricular, no posterior auricular and no occipital adenopathy present.    She has no cervical adenopathy.       Right: No supraclavicular adenopathy present.       Left: No supraclavicular adenopathy present.  Neurological: She is alert and oriented to person, place, and time.  Skin: Skin is warm and dry.  Psychiatric: She has a normal mood and affect. Her behavior is normal. Judgment and thought content normal.  Vitals reviewed.   Assessment and Plan :  Dry throat  Post-nasal drip   I suspect the symptoms are allergy related.  Though could be heartburn though she is not having any symptoms.  We discussed symptomatic care that can be done at home.  She will do a trial off of Flonase to see how much that medication is actually helping her.  She will increase her oral hydration.  She will continue using a humidifier in her bedroom while she sleeps.  D/w pt that her symptoms are likely related to [place] and the weather - reassured patient that she is ok and we discussed things that she can do OTC that may help her symptoms.  Patient verbalized to me that they understand the following: diagnosis, what is being done for them, what to expect and what should be done at home.  Their questions have  been answered.  See after visit summary for patient specific instructions.  Benny LennertSarah Faith Branan PA-C  [facility] at [facility] 01/14/2018 2:41 PM  Please note: Portions of this report may have been transcribed using dragon voice recognition software. Every effort was made to ensure accuracy; however, inadvertent computerized transcription errors may be present.

## 2018-03-14 ENCOUNTER — Encounter: Payer: Self-pay | Admitting: Family Medicine

## 2018-05-29 ENCOUNTER — Other Ambulatory Visit: Payer: Self-pay

## 2018-05-29 ENCOUNTER — Encounter: Payer: Self-pay | Admitting: Emergency Medicine

## 2018-05-29 ENCOUNTER — Ambulatory Visit: Payer: BLUE CROSS/BLUE SHIELD | Admitting: Emergency Medicine

## 2018-05-29 VITALS — BP 129/73 | HR 69 | Temp 98.3°F | Resp 16 | Ht 60.5 in | Wt 104.8 lb

## 2018-05-29 DIAGNOSIS — J392 Other diseases of pharynx: Secondary | ICD-10-CM

## 2018-05-29 DIAGNOSIS — R6889 Other general symptoms and signs: Secondary | ICD-10-CM | POA: Diagnosis not present

## 2018-05-29 DIAGNOSIS — J029 Acute pharyngitis, unspecified: Secondary | ICD-10-CM

## 2018-05-29 DIAGNOSIS — Z23 Encounter for immunization: Secondary | ICD-10-CM

## 2018-05-29 MED ORDER — AZITHROMYCIN 250 MG PO TABS: ORAL_TABLET | ORAL | 0 refills | Status: DC

## 2018-05-29 MED ORDER — PSEUDOEPHEDRINE-GUAIFENESIN ER 60-600 MG PO TB12: 1.0000 | ORAL_TABLET | Freq: Two times a day (BID) | ORAL | 1 refills | Status: AC

## 2018-05-29 NOTE — Addendum Note (Signed)
Addended by: Georg Ruddle A on: 05/29/2018 11:34 AM   Modules accepted: Orders

## 2018-05-29 NOTE — Patient Instructions (Addendum)
If you have lab work done today you will be contacted with your lab results within the next 2 weeks.  If you have not heard from us then please contact us. The fastest way to get your results is to register for My Chart.   IF you received an x-ray today, you will receive an invoice from [facility]. Please contact [facility] at [phone] with questions or concerns regarding your invoice.   IF you received labwork today, you will receive an invoice from [facility]. Please contact [facility] at [phone] with questions or concerns regarding your invoice.   Our billing staff will not be able to assist you with questions regarding bills from these companies.  You will be contacted with the lab results as soon as they are available. The fastest way to get your results is to activate your My Chart account. Instructions are located on the last page of this paperwork. If you have not heard from us regarding the results in 2 weeks, please contact this office.      Pharyngitis Pharyngitis is a sore throat (pharynx). There is redness, pain, and swelling of your throat. Follow these instructions at home:  Drink enough fluids to keep your pee (urine) clear or pale yellow.  Only take medicine as told by your doctor. ? You may get sick again if you do not take medicine as told. Finish your medicines, even if you start to feel better. ? Do not take aspirin.  Rest.  Rinse your mouth (gargle) with salt water ( tsp of salt per 1 qt of water) every 1-2 hours. This will help the pain.  If you are not at risk for choking, you can suck on hard candy or sore throat lozenges. Contact a doctor if:  You have large, tender lumps on your neck.  You have a rash.  You cough up green, yellow-brown, or bloody spit. Get help right away if:  You have a stiff neck.  You drool or cannot swallow liquids.  You throw up (vomit) or are not able to keep medicine or liquids down.  You have  very bad pain that does not go away with medicine.  You have problems breathing (not from a stuffy nose). This information is not intended to replace advice given to you by your health care provider. Make sure you discuss any questions you have with your health care provider. Document Released: 12/26/2007 Document Revised: 12/15/2015 Document Reviewed: 03/16/2013 Elsevier Interactive Patient Education  2017 Elsevier Inc.  

## 2018-05-29 NOTE — Progress Notes (Signed)
Judith Sherman 62 y.o.   Chief Complaint  Patient presents with  . Sore Throat    not sore and dry per patient mucus in throat - gray and pink x 3-4 months    HISTORY OF PRESENT ILLNESS: [name] complaining of sore throat with increased secretions but at times dry throat. No other significant symptoms. HPI   Prior to Admission medications   Medication Sig Start Date End Date Taking? Authorizing Provider  acetaminophen (TYLENOL) 500 MG tablet Take 2 tablets (1,000 mg total) by mouth every 8 (eight) hours as needed. Take 2 tabs every 8 hours. 06/07/16  Yes Quentin Angst, MD  cetirizine (ZYRTEC) 10 MG tablet Take 1 tablet (10 mg total) by mouth daily. One tab daily for allergies 12/06/15  Yes Kindl, Quita Skye, MD  OVER THE COUNTER MEDICATION    Yes [provider]  prochlorperazine (COMPAZINE) 10 MG tablet Take 1 tablet (10 mg total) by mouth 2 (two) times daily as needed (for headache). 12/24/15  Yes Margarita Grizzle, MD  simvastatin (ZOCOR) 40 MG tablet Take 1 tablet (40 mg total) by mouth at bedtime. 12/19/17  Yes Doristine Bosworth, MD  SUMAtriptan (IMITREX) 50 MG tablet Take 1 tablet (50 mg total) by mouth every 2 (two) hours as needed for migraine. May repeat in 2 hours if headache persists or recurs. 01/17/16  Yes Langeland, Dawn T, MD  fluticasone (FLONASE) 50 MCG/ACT nasal spray Place 1 spray into both nostrils daily. 12/19/17   Doristine Bosworth, MD    Allergies  Allergen Reactions  . Aspirin     Patient Active Problem List   Diagnosis Date Noted  . Hypovitaminosis D 10/03/2016  . Tooth decay 06/07/2016  . Language barrier 02/24/2016  . Vaginal dryness 02/24/2016  . Migraine without aura and responsive to treatment 12/14/2015  . Other seasonal allergic rhinitis 12/14/2015  . Degenerative disc disease, lumbar 05/20/2015  . Hyperlipidemia 05/20/2015  . Loss of weight 05/06/2014  . Gastroesophageal reflux disease without esophagitis 05/06/2014  .  Dyslipidemia 05/06/2014  . Annual physical exam 01/18/2014  . Establishing care with new doctor, encounter for 01/12/2014    Past Medical History:  Diagnosis Date  . Allergic rhinitis   . Hyperlipidemia   . Hypertension   . LPRD (laryngopharyngeal reflux disease)    Evaluated by [facility]    Past Surgical History:  Procedure Laterality Date  . BREAST LUMPECTOMY     benign  . COLONOSCOPY N/A 07/12/2014   SLF:  1. small ulcer in teh terminal ileum 2. Small internal hemorrhoids.  . TONSILLECTOMY      Social History   Socioeconomic History  . Marital status: Married    Spouse name: Not on file  . Number of children: 0  . Years of education: Not on file  . Highest education level: Not on file  Occupational History  . Occupation: unemployed  Social Needs  . Financial resource strain: Not on file  . Food insecurity:    Worry: Not on file    Inability: Not on file  . Transportation needs:    Medical: Not on file    Non-medical: Not on file  Tobacco Use  . Smoking status: Never Smoker  . Smokeless tobacco: Never Used  Substance and Sexual Activity  . Alcohol use: No    Alcohol/week: 0.0 standard drinks  . Drug use: No  . Sexual activity: Not on file  Lifestyle  . Physical  activity:    Days per week: Not on file    Minutes per session: Not on file  . Stress: Not on file  Relationships  . Social connections:    Talks on phone: Not on file    Gets together: Not on file    Attends religious service: Not on file    Active member of club or organization: Not on file    Attends meetings of clubs or organizations: Not on file    Relationship status: Not on file  . Intimate partner violence:    Fear of current or ex partner: Not on file    Emotionally abused: Not on file    Physically abused: Not on file    Forced sexual activity: Not on file  Other Topics Concern  . Not on file  Social History Narrative  . Not on file    Family  History  Problem Relation Age of Onset  . Hypertension Mother   . Colon cancer Neg Hx      Review of Systems  Constitutional: Negative.  Negative for chills and fever.  HENT: Positive for congestion and sore throat.   Eyes: Negative for blurred vision and double vision.  Respiratory: Negative for cough and shortness of breath.   Cardiovascular: Negative.  Negative for chest pain and palpitations.  Gastrointestinal: Negative for nausea and vomiting.  Skin: Negative.  Negative for rash.  Neurological: Negative.  Negative for dizziness and headaches.  Endo/Heme/Allergies: Negative.     Vitals:   05/29/18 1054  BP: 129/73  Pulse: 69  Resp: 16  Temp: 98.3 F (36.8 C)  SpO2: 98%    Physical Exam  Constitutional: She is oriented to person, place, and time. She appears well-developed and well-nourished.  HENT:  Head: Normocephalic and atraumatic.  Mouth/Throat: Posterior oropharyngeal erythema present. No oropharyngeal exudate or posterior oropharyngeal edema.  Eyes: Pupils are equal, round, and reactive to light. Conjunctivae and EOM are normal.  Neck: Normal range of motion. Neck supple.  Cardiovascular: Normal rate and regular rhythm.  Pulmonary/Chest: Effort normal and breath sounds normal.  Musculoskeletal: Normal range of motion.  Lymphadenopathy:    She has no cervical adenopathy.  Neurological: She is alert and oriented to person, place, and time. No sensory deficit. She exhibits normal muscle tone.  Skin: Skin is warm. Capillary refill takes less than 2 seconds.  Psychiatric: She has a normal mood and affect. Her behavior is normal.  Vitals reviewed.  A total of 25 minutes was spent in the room with the patient, greater than 50% of which was in counseling/coordination of care regarding differential diagnosis, treatment, medications, and need for follow-up if no better or worse.   ASSESSMENT & PLAN: Judith Sherman was seen today for sore throat.  Diagnoses and all orders for  this visit:  Dry throat  Acute pharyngitis, unspecified etiology -     azithromycin (ZITHROMAX) 250 MG tablet; Sig as indicated  Congestion of throat -     pseudoephedrine-guaifenesin (MUCINEX D) 60-600 MG 12 hr tablet; Take 1 tablet by mouth every 12 (twelve) hours for 5 days.    Patient Instructions       If you have lab work done today you will be contacted with your lab results within the next 2 weeks.  If you have not heard from [place] then please contact us. The fastest way to get your results is to register for My Chart.   IF you received an x-ray today, you will receive an invoice  from [facility]. Please contact [facility] at [phone] with questions or concerns regarding your invoice.   IF you received labwork today, you will receive an invoice from [facility]. Please contact [facility] at [phone] with questions or concerns regarding your invoice.   Our billing staff will not be able to assist you with questions regarding bills from these companies.  You will be contacted with the lab results as soon as they are available. The fastest way to get your results is to activate your My Chart account. Instructions are located on the last page of this paperwork. If you have not heard from [place] regarding the results in 2 weeks, please contact this office.     Pharyngitis Pharyngitis is a sore throat (pharynx). There is redness, pain, and swelling of your throat. Follow these instructions at home:  Drink enough fluids to keep your pee (urine) clear or pale yellow.  Only take medicine as told by your doctor. ? You may get sick again if you do not take medicine as told. Finish your medicines, even if you start to feel better. ? Do not take aspirin.  Rest.  Rinse your mouth (gargle) with salt water ( tsp of salt per 1 qt of water) every 1-2 hours. This will help the pain.  If you are not at risk for choking, you can suck on hard candy or sore throat  lozenges. Contact a doctor if:  You have large, tender lumps on your neck.  You have a rash.  You cough up green, yellow-brown, or bloody spit. Get help right away if:  You have a stiff neck.  You drool or cannot swallow liquids.  You throw up (vomit) or are not able to keep medicine or liquids down.  You have very bad pain that does not go away with medicine.  You have problems breathing (not from a stuffy nose). This information is not intended to replace advice given to you by your health care provider. Make sure you discuss any questions you have with your health care provider. Document Released: 12/26/2007 Document Revised: 12/15/2015 Document Reviewed: 03/16/2013 Elsevier Interactive Patient Education  2017 Elsevier Inc.      Edwina Barth, MD [facility]

## 2018-06-13 NOTE — Progress Notes (Signed)
REVIEWED-NO ADDITIONAL RECOMMENDATIONS. 

## 2018-07-08 ENCOUNTER — Encounter: Payer: Self-pay | Admitting: Family Medicine

## 2018-07-08 ENCOUNTER — Ambulatory Visit: Payer: BLUE CROSS/BLUE SHIELD | Admitting: Family Medicine

## 2018-07-08 ENCOUNTER — Other Ambulatory Visit: Payer: Self-pay

## 2018-07-08 VITALS — BP 116/77 | HR 72 | Temp 97.9°F | Ht 60.5 in | Wt 101.8 lb

## 2018-07-08 DIAGNOSIS — J302 Other seasonal allergic rhinitis: Secondary | ICD-10-CM

## 2018-07-08 DIAGNOSIS — K219 Gastro-esophageal reflux disease without esophagitis: Secondary | ICD-10-CM | POA: Diagnosis not present

## 2018-07-08 DIAGNOSIS — R0982 Postnasal drip: Secondary | ICD-10-CM | POA: Diagnosis not present

## 2018-07-08 MED ORDER — FAMOTIDINE 20 MG PO TABS: 20.0000 mg | ORAL_TABLET | Freq: Every day | ORAL | 2 refills | Status: DC

## 2018-07-08 MED ORDER — FLUTICASONE PROPIONATE 50 MCG/ACT NA SUSP: 1.0000 | Freq: Two times a day (BID) | NASAL | 2 refills | Status: DC

## 2018-07-08 NOTE — Patient Instructions (Addendum)
   If you have lab work done today you will be contacted with your lab results within the next 2 weeks.  If you have not heard from us then please contact us. The fastest way to get your results is to register for My Chart.   IF you received an x-ray today, you will receive an invoice from [facility]. Please contact [facility] at [phone] with questions or concerns regarding your invoice.   IF you received labwork today, you will receive an invoice from [facility]. Please contact [facility] at [phone] with questions or concerns regarding your invoice.   Our billing staff will not be able to assist you with questions regarding bills from these companies.  You will be contacted with the lab results as soon as they are available. The fastest way to get your results is to activate your My Chart account. Instructions are located on the last page of this paperwork. If you have not heard from us regarding the results in 2 weeks, please contact this office.     Postnasal Drip Postnasal drip is the feeling of mucus going down the back of your throat. Mucus is a slimy substance that moistens and cleans your nose and throat, as well as the air pockets in face bones near your forehead and cheeks (sinuses). Small amounts of mucus pass from your nose and sinuses down the back of your throat all the time. This is normal. When you produce too much mucus or the mucus gets too thick, you can feel it. Some common causes of postnasal drip include:  Having more mucus because of: ? A cold or the flu. ? Allergies. ? Cold air. ? Certain medicines.  Having more mucus that is thicker because of: ? A sinus or nasal infection. ? Dry air. ? A food allergy.  Follow these instructions at home: Relieving discomfort  Gargle with a salt-water mixture 3-4 times a day or as needed. To make a salt-water mixture, completely dissolve -1 tsp of salt in 1 cup of warm water.  If the air in your  home is dry, use a humidifier to add moisture to the air.  Use a saline spray or container (neti pot) to flush out the nose (nasal irrigation). These methods can help clear away mucus and keep the nasal passages moist. General instructions  Take over-the-counter and prescription medicines only as told by your health care provider.  Follow instructions from your health care provider about eating or drinking restrictions. You may need to avoid caffeine.  Avoid things that you know you are allergic to (allergens), like dust, mold, pollen, pets, or certain foods.  Drink enough fluid to keep your urine pale yellow.  Keep all follow-up visits as told by your health care provider. This is important. Contact a health care provider if:  You have a fever.  You have a sore throat.  You have difficulty swallowing.  You have headache.  You have sinus pain.  You have a cough that does not go away.  The mucus from your nose becomes thick and is green or yellow in color.  You have cold or flu symptoms that last more than 10 days. Summary  Postnasal drip is the feeling of mucus going down the back of your throat.  If your health care provider approves, use nasal irrigation or a nasal spray 2?4 times a day.  Avoid things that you know you are allergic to (allergens), like dust, mold, pollen, pets, or certain foods.   This information is not intended to replace advice given to you by your health care provider. Make sure you discuss any questions you have with your health care provider. Document Released: 10/22/2016 Document Revised: 10/22/2016 Document Reviewed: 10/22/2016 Elsevier Interactive Patient Education  2018 Elsevier Inc.  

## 2018-07-08 NOTE — Progress Notes (Signed)
12/17/201910:45 AM  Reggie Shelly Rubensteinhi Desir April 10, 1956, 62 y.o. female [id number]  Chief Complaint  Patient presents with  . Nasal Congestion    sore throat, chest congestion and lots of mucus. Took the z pak. did not clear up all symptoms. Not sure what pharmacy she is using right now. May need hand written rx    HPI:   Patient is a 62 y.o. female who presents today for congestion and sore throat  At night her throat becomes very dry Having lots of mucous - thin, mostly white No nasal congestion since she started using flonase and zyrtec every morning Having intermittent heartburn, not waking up with metallic taste but does have halitosis Sometimes sleep with mouth open Drinking water and using humidifier not helping No cough, SOB, fever or chills, nausea  Was seen early nov, treated with azithromycin for pharyngitis  Fall Risk  07/08/2018 12/19/2017 11/13/2017 04/30/2017 06/07/2016  Falls in the past year? 0 No No No No     [facility] 07/08/2018 12/19/2017 11/13/2017  Decreased Interest 0 0 0  Down, Depressed, Hopeless 0 0 0  PHQ - 2 Score 0 0 0  Altered sleeping - - -  Tired, decreased energy - - -  Change in appetite - - -  Feeling bad or failure about yourself  - - -  Trouble concentrating - - -  Moving slowly or fidgety/restless - - -  Suicidal thoughts - - -  PHQ-9 Score - - -  Difficult doing work/chores - - -    Allergies  Allergen Reactions  . Aspirin     Prior to Admission medications   Medication Sig Start Date End Date Taking? Authorizing Provider  acetaminophen (TYLENOL) 500 MG tablet Take 2 tablets (1,000 mg total) by mouth every 8 (eight) hours as needed. Take 2 tabs every 8 hours. 06/07/16  Yes Quentin AngstJegede, Olugbemiga E, MD  cetirizine (ZYRTEC) 10 MG tablet Take 1 tablet (10 mg total) by mouth daily. One tab daily for allergies 12/06/15  Yes Kindl, Quita SkyeJames D, MD  fluticasone [facility](FLONASE) 50 MCG/ACT nasal spray Place 1 spray into both nostrils daily.  12/19/17  Yes Doristine BosworthStallings, Zoe A, MD  OVER THE COUNTER MEDICATION    Yes [provider]  prochlorperazine (COMPAZINE) 10 MG tablet Take 1 tablet (10 mg total) by mouth 2 (two) times daily as needed (for headache). 12/24/15  Yes Margarita Grizzleay, Danielle, MD  simvastatin (ZOCOR) 40 MG tablet Take 1 tablet (40 mg total) by mouth at bedtime. 12/19/17  Yes Doristine BosworthStallings, Zoe A, MD  SUMAtriptan (IMITREX) 50 MG tablet Take 1 tablet (50 mg total) by mouth every 2 (two) hours as needed for migraine. May repeat in 2 hours if headache persists or recurs. 01/17/16  Yes Pete GlatterLangeland, Dawn T, MD    Past Medical History:  Diagnosis Date  . Allergic rhinitis   . Hyperlipidemia   . Hypertension   . LPRD (laryngopharyngeal reflux disease)    [facility]    Past Surgical History:  Procedure Laterality Date  . BREAST LUMPECTOMY     benign  . COLONOSCOPY N/A 07/12/2014   SLF:  1. small ulcer in teh terminal ileum 2. Small internal hemorrhoids.  . TONSILLECTOMY      Social History   Tobacco Use  . Smoking status: Never Smoker  . Smokeless tobacco: Never Used  Substance Use Topics  . Alcohol use: No    Alcohol/week: 0.0 standard drinks    Family History  Problem Relation Age of Onset  . Hypertension Mother   . Colon cancer Neg Hx     ROS Per hpi  OBJECTIVE:  Blood pressure 116/77, pulse 72, temperature 97.9 F (36.6 C), temperature source Oral, height 5' 0.5" (1.537 m), weight 101 lb 12.8 oz (46.2 kg), SpO2 97 %. Body mass index is 19.55 kg/m.   Physical Exam Vitals signs and nursing note reviewed.  Constitutional:      Appearance: She is well-developed.  HENT:     Head: Normocephalic and atraumatic.     Right Ear: Hearing, tympanic membrane, ear canal and external ear normal.     Left Ear: Hearing, tympanic membrane, ear canal and external ear normal.     Mouth/Throat:     Pharynx: No oropharyngeal exudate or posterior oropharyngeal erythema.      Comments: + cobblestoning Eyes:     Conjunctiva/sclera: Conjunctivae normal.     Pupils: Pupils are equal, round, and reactive to light.  Neck:     Musculoskeletal: Neck supple.  Cardiovascular:     Rate and Rhythm: Normal rate and regular rhythm.     Heart sounds: Normal heart sounds. No murmur. No friction rub. No gallop.   Pulmonary:     Effort: Pulmonary effort is normal.     Breath sounds: Normal breath sounds. No wheezing or rales.  Abdominal:     General: Bowel sounds are normal. There is no distension.     Palpations: Abdomen is soft.     Tenderness: There is no abdominal tenderness.  Lymphadenopathy:     Cervical: No cervical adenopathy.  Skin:    General: Skin is warm and dry.  Neurological:     Mental Status: She is alert and oriented to person, place, and time.      ASSESSMENT and PLAN  1. Post-nasal drip Uncontrolled. Increase flonase to BID, consider adding azelastine 2. Other seasonal allergic rhinitis - fluticasone (FLONASE) 50 MCG/ACT nasal spray; Place 1 spray into both nostrils 2 (two) times daily.  3. Gastroesophageal reflux disease, esophagitis presence not specified New diagnosis. Starting H2B. Discussed LFM  Other orders - famotidine (PEPCID) 20 MG tablet; Take 1 tablet (20 mg total) by mouth at bedtime.  Return if symptoms worsen or fail to improve.    Myles Lipps, MD Primary Care at [facility] [address] [place], [place] [address] Ph.  [phone] Fax [phone]

## 2018-08-09 ENCOUNTER — Encounter: Payer: Self-pay | Admitting: Family Medicine

## 2018-08-09 ENCOUNTER — Ambulatory Visit (INDEPENDENT_AMBULATORY_CARE_PROVIDER_SITE_OTHER): Payer: BLUE CROSS/BLUE SHIELD | Admitting: Family Medicine

## 2018-08-09 ENCOUNTER — Other Ambulatory Visit: Payer: Self-pay

## 2018-08-09 VITALS — BP 130/72 | HR 65 | Temp 97.7°F | Ht 62.0 in | Wt 101.2 lb

## 2018-08-09 DIAGNOSIS — R0982 Postnasal drip: Secondary | ICD-10-CM | POA: Diagnosis not present

## 2018-08-09 DIAGNOSIS — J302 Other seasonal allergic rhinitis: Secondary | ICD-10-CM

## 2018-08-09 DIAGNOSIS — K219 Gastro-esophageal reflux disease without esophagitis: Secondary | ICD-10-CM

## 2018-08-09 DIAGNOSIS — H6993 Unspecified Eustachian tube disorder, bilateral: Secondary | ICD-10-CM | POA: Diagnosis not present

## 2018-08-09 MED ORDER — PANTOPRAZOLE SODIUM 40 MG PO TBEC: 40.0000 mg | DELAYED_RELEASE_TABLET | Freq: Every day | ORAL | 3 refills | Status: DC

## 2018-08-09 MED ORDER — PREDNISONE 10 MG PO TABS: 40.0000 mg | ORAL_TABLET | Freq: Every day | ORAL | 0 refills | Status: AC

## 2018-08-09 NOTE — Progress Notes (Signed)
Established Patient Office Visit  Subjective:  Patient ID: Judith Sherman, female    DOB: Feb 14, 1956  Age: 63 y.o. MRN: [id number]  CC:  Chief Complaint  Patient presents with  . Sore Throat    off and on for 5-6 months. Less mucus and more pain   . dry throat    doesnt hurt to swallow.     HPI Judith Sherman presents for   Translator number [phone]  Patient reports that she has sore throat and mucus Patient has been seen multiple times for sore throat, postnasal drip and cough She reports that she has lots of mucus She denies coughing but states that her mucus is still a lot She is taking She was seen by Dr. Leretha PolSantiago who prescribed zpak and pepcid which did not help much She would like to get prednisone because that seemed to help her husband   GERD She has reflux and she gets the urge to clear her throat She was told that protonix could help this She needs refill of the protonix   Past Medical History:  Diagnosis Date  . Allergic rhinitis   . Hyperlipidemia   . Hypertension   . LPRD (laryngopharyngeal reflux disease)    Evaluated by BJY:[facility]    Past Surgical History:  Procedure Laterality Date  . BREAST LUMPECTOMY     benign  . COLONOSCOPY N/A 07/12/2014   SLF:  1. small ulcer in teh terminal ileum 2. Small internal hemorrhoids.  . TONSILLECTOMY      Family History  Problem Relation Age of Onset  . Hypertension Mother   . Colon cancer Neg Hx     Social History   Socioeconomic History  . Marital status: Married    Spouse name: Not on file  . Number of children: 0  . Years of education: Not on file  . Highest education level: Not on file  Occupational History  . Occupation: unemployed  Social Needs  . Financial resource strain: Not on file  . Food insecurity:    Worry: Not on file    Inability: Not on file  . Transportation needs:    Medical: Not on file    Non-medical: Not on file  Tobacco Use  . Smoking  status: Never Smoker  . Smokeless tobacco: Never Used  Substance and Sexual Activity  . Alcohol use: No    Alcohol/week: 0.0 standard drinks  . Drug use: No  . Sexual activity: Not on file  Lifestyle  . Physical activity:    Days per week: Not on file    Minutes per session: Not on file  . Stress: Not on file  Relationships  . Social connections:    Talks on phone: Not on file    Gets together: Not on file    Attends religious service: Not on file    Active member of club or organization: Not on file    Attends meetings of clubs or organizations: Not on file    Relationship status: Not on file  . Intimate partner violence:    Fear of current or ex partner: Not on file    Emotionally abused: Not on file    Physically abused: Not on file    Forced sexual activity: Not on file  Other Topics Concern  . Not on file  Social History Narrative  . Not on file    Outpatient Medications Prior to Visit  Medication Sig Dispense Refill  . acetaminophen (TYLENOL)  500 MG tablet Take 2 tablets (1,000 mg total) by mouth every 8 (eight) hours as needed. Take 2 tabs every 8 hours. 30 tablet 0  . cetirizine (ZYRTEC) 10 MG tablet Take 1 tablet (10 mg total) by mouth daily. One tab daily for allergies 30 tablet 1  . famotidine (PEPCID) 20 MG tablet Take 1 tablet (20 mg total) by mouth at bedtime. 30 tablet 2  . fluticasone (FLONASE) 50 MCG/ACT nasal spray Place 1 spray into both nostrils 2 (two) times daily. 16 g 2  . OVER THE COUNTER MEDICATION     . prochlorperazine (COMPAZINE) 10 MG tablet Take 1 tablet (10 mg total) by mouth 2 (two) times daily as needed (for headache). 10 tablet 0  . simvastatin (ZOCOR) 40 MG tablet Take 1 tablet (40 mg total) by mouth at bedtime. 90 tablet 3  . SUMAtriptan (IMITREX) 50 MG tablet Take 1 tablet (50 mg total) by mouth every 2 (two) hours as needed for migraine. May repeat in 2 hours if headache persists or recurs. 30 tablet 1   No facility-administered  medications prior to visit.     Allergies  Allergen Reactions  . Aspirin     ROS Review of Systems    Objective:    Physical Exam  BP 130/72 (BP Location: Right Arm, Patient Position: Sitting, Cuff Size: Normal)   Pulse 65   Temp 97.7 F (36.5 C) (Oral)   Ht 5\' 2"  (1.575 m)   Wt 101 lb 3.2 oz (45.9 kg)   SpO2 98%   BMI 18.51 kg/m  Wt Readings from Last 3 Encounters:  08/09/18 101 lb 3.2 oz (45.9 kg)  07/08/18 101 lb 12.8 oz (46.2 kg)  05/29/18 104 lb 12.8 oz (47.5 kg)   General: alert, oriented, in NAD Head: normocephalic, atraumatic, no sinus tenderness Eyes: EOM intact, no scleral icterus or conjunctival injection Ears: TM clear bilaterally, bulging with clear fluid Nose: mucosa nonerythematous, nonedematous Throat: no pharyngeal exudate or erythema Lymph: no posterior auricular, submental or cervical lymph adenopathy Heart: normal rate, normal sinus rhythm, no murmurs Lungs: clear to auscultation bilaterally, no wheezing    There are no preventive care reminders to display for this patient.  There are no preventive care reminders to display for this patient.    Assessment & Plan:   Problem List Items Addressed This Visit      Respiratory   Other seasonal allergic rhinitis   Relevant Orders   Ambulatory referral to Allergy     Digestive   Gastroesophageal reflux disease without esophagitis  - refilled her protonix for her GERD since she will be on Prednisone   Relevant Medications   pantoprazole (PROTONIX) 40 MG tablet    Other Visit Diagnoses    Post-nasal drip    -  Primary  -  Continue allergy meds   Relevant Orders   Ambulatory referral to Allergy   Disorder of both eustachian tubes    - will give steroid burst   Relevant Orders   Ambulatory referral to Allergy      Meds ordered this encounter  Medications  . predniSONE (DELTASONE) 10 MG tablet    Sig: Take 4 tablets (40 mg total) by mouth daily with breakfast for 5 days.    Dispense:   20 tablet    Refill:  0  . pantoprazole (PROTONIX) 40 MG tablet    Sig: Take 1 tablet (40 mg total) by mouth daily.    Dispense:  30 tablet  Refill:  3    Follow-up: No follow-ups on file.    Doristine Bosworth, MD

## 2018-08-09 NOTE — Patient Instructions (Addendum)
If you have lab work done today you will be contacted with your lab results within the next 2 weeks.  If you have not heard from [place] then please contact us. The fastest way to get your results is to register for My Chart.   IF you received an x-ray today, you will receive an invoice from [facility]. Please contact [facility] at [phone] with questions or concerns regarding your invoice.   IF you received labwork today, you will receive an invoice from [facility]. Please contact [facility] at [phone] with questions or concerns regarding your invoice.   Our billing staff will not be able to assist you with questions regarding bills from these companies.  You will be contacted with the lab results as soon as they are available. The fastest way to get your results is to activate your My Chart account. Instructions are located on the last page of this paperwork. If you have not heard from [place] regarding the results in 2 weeks, please contact this office.     Vim m?i d? ?ng, Ng??i l?n Allergic Rhinitis, Adult Vim m?i d? ?ng l m?t ph?n ?ng d? ?ng ?nh h??ng ??n nim m?c bn trong m?i. Tnh tr?ng ny gy h?t h?i, ch?y n??c m?i ho?c ngh?t m?i v c?m th?y d?ch nh?y ?i xu?ng thnh sau h?ng (ch?y n??c m?i sau). Vim m?i d? ?ng c th? t? nh? ??n n?ng. C hai lo?i vim m?i d? ?ng:  Theo ma. Lo?i ny c?ng ???c g?i l s?t ma c? kh. N ch? x?y ra trong nh?ng ma nh?t ??nh.  Quanh n?m. Lo?i vim m?i d? ?ng ny x?y ra vo b?t k? th?i ?i?m no trong n?m. Nguyn nhn g gy ra? Tnh tr?ng ny x?y ra khi h? th?ng b?o v? c? th? (h? mi?n d?ch) ph?n ?ng v?i m?t s? ch?t v h?i nh?t ??nh ???c g?i l cc ch?t gy d? ?ng nh? th? cc ch?t ny l m?m b?nh.  Vim m?i d? ?ng theo ma do ph?n hoa gy ra, ph?n hoa c th? ??n t? c?, cy v c? d?i. Vim m?i d? ?ng quanh n?m c th? l do:  M?t b?i trong nh.  V?y da v?t nui.  Bo t? n?m m?c. Cc d?u hi?u ho?c tri?u ch?ng l g? Nh?ng  tri?u ch?ng c?a tnh tr?ng ny bao g?m:  H?t h?i.  Ch?y n??c m?i ho?c ng?t m?i (ngh?t m?i).  S? mu?i pha sau.  Ng?a m?i.  Ch?y n??c m?t.  Kh ng?Marland Kitchen  Bu?n ng? vo ban ngy. Ch?n ?on tnh tr?ng ny nh? th? no? Tnh tr?ng ny c th? ???c ch?n ?on d?a vo:  B?nh s? c?a qu v?.  Khm th?c th?.  Xt nghi?m ?? ki?m tra cc tnh tr?ng lin quan, ch?ng h?n nh?: ? Hen suy?n. ? B?nh ?au m?t ??. ? Nhi?m trng tai. ? Nhi?m trng ???ng h h?p trn.  Xt nghi?m ?? tm ra ch?t gy d? ?ng no gy ra cc tri?u ch?ng c?a qu v?. Cc xt nghi?m ny c th? bao g?m xt nghi?m mu ho?c xt nghi?m trn da. Tnh tr?ng ny ???c ?i?u tr? nh? th? no? Khng c cch ?i?u tr? kh?i tnh tr?ng ny, nh?ng vi?c ?i?u tr? c th? gip ki?m sot cc tri?u ch?ng. ?i?u tr? c th? bao g?m:  Dng thu?c ?? ng?n ch?n cc tri?u ch?ng d? ?ng, ch?ng h?n nh? thu?c khng histamine. C th? dng thu?c d??i d?ng thu?c tim, thu?c x?t m?i, ho?c thu?c  d?ng vin u?ng.  Hessie Diener ch?t gy d? ?ng.  Kh? nh?y. Ph??ng php ?i?u tr? ny lin quan ??n vi?c tim lin t?c cho ??n khi c? th? c?a qu v? tr? ln t nh?y c?m h?n v?i ch?t gy d? ?ng. C th? th?c hi?n ph??ng php ?i?u tr? ny n?u cc ph??ng php ?i?u tr? khc khng c tc d?ng.  N?u dng thu?c v trnh ch?t gy d? ?ng khng c tc d?ng, c th? cc lo?i thu?c m?i, m?nh h?n s? ???c k ??n. Tun th? nh?ng h??ng d?n ny ? nh:  Tm hi?u xem qu v? b? d? ?ng v?i ci g. Cc ch?t gy d? ?ng ph? bi?n l khi, b?i v ph?n hoa.  Trnh nh?ng th? qu v? b? d? ?ng. ?y l m?t s? vi?c qu v? c th? lm ?? gip trnh cc ch?t gy d? ?ng: ? Thay th? th?m b?ng sn g?, ? lt ho?c nh?a vinyl. Th?m c th? dnh v?y da v b?i. ? Khng ht thu?c. Khng cho php ht thu?c trong nh qu v?. ? Thay b? l?c l s??i v ?i?u ho khng kh t nh?t l m?i thng m?t l?n. ? Trong ma d? ?ng:  ?ng c?a s? cng nhi?u cng t?t.  Ln k? ho?ch cho cc ho?t ??ng ngoi tr?i khi l??ng ph?n hoa ? m?c th?p  nh?t. Th?i ?i?m ny th??ng l trong cc gi? vo bu?i t?i.  Khi vo trong nh, hy thay qu?n o v t?m r?a tr??c khi ng?i ln gh? ho?c ga tr?i gi??ng.  Ch? s? d?ng thu?c khng k ??n v thu?c k ??n theo ch? d?n c?a chuyn gia ch?m [place] s?c kh?e.  Tun th? t?t c? cc l?n khm theo di theo ch? d?n c?a chuyn gia ch?m [place] s?c kh?e. ?i?u ny c vai tr quan tr?ng. Hy lin l?c v?i chuyn gia ch?m [place] s?c kh?e n?u:  Qu v? b? s?t.  Qu v? b? ho dai d?ng.  Qu v? t?o ra ti?ng so khi th? (qu v? th? kh kh).  Cc tri?u ch?ng gy tr? ng?i cho cc sinh ho?t th??ng ngy c?a qu v?. Yu c?u tr? gip ngay l?p t?c n?u:  Qu v? b? kh th?. Tm t?t  Tnh tr?ng ny c th? x? tr ???c b?ng cch dng cc lo?i thu?c theo ch? d?n v trnh cc ch?t gy d? ?ng.  Hy lin l?c v?i chuyn gia ch?m [place] s?c kh?e c?a qu v? n?u qu v? b? ho ho?c s?t dai d?ng.  Trong ma d? ?ng, hy ?ng c?a s? cng nhi?u cng t?t. Thng tin ny khng nh?m m?c ?ch thay th? cho l?i khuyn m chuyn gia ch?m [place] s?c kh?e ni v?i qu v?. Hy b?o ??m qu v? ph?i th?o lu?n b?t k? v?n ?? g m qu v? c v?i chuyn gia ch?m [place] s?c kh?e c?a qu v?. Document Released: 10/31/2015 Document Revised: 10/22/2016 Document Reviewed: 10/22/2016 Elsevier Interactive Patient Education  2019 [employer].

## 2018-09-04 ENCOUNTER — Ambulatory Visit (INDEPENDENT_AMBULATORY_CARE_PROVIDER_SITE_OTHER): Payer: BLUE CROSS/BLUE SHIELD | Admitting: Allergy

## 2018-09-04 ENCOUNTER — Encounter: Payer: Self-pay | Admitting: Allergy

## 2018-09-04 VITALS — BP 118/74 | HR 81 | Temp 98.1°F | Resp 16 | Ht 61.0 in | Wt 102.8 lb

## 2018-09-04 DIAGNOSIS — J302 Other seasonal allergic rhinitis: Secondary | ICD-10-CM | POA: Diagnosis not present

## 2018-09-04 DIAGNOSIS — J3089 Other allergic rhinitis: Secondary | ICD-10-CM | POA: Diagnosis not present

## 2018-09-04 DIAGNOSIS — R05 Cough: Secondary | ICD-10-CM | POA: Diagnosis not present

## 2018-09-04 DIAGNOSIS — R059 Cough, unspecified: Secondary | ICD-10-CM | POA: Insufficient documentation

## 2018-09-04 DIAGNOSIS — K219 Gastro-esophageal reflux disease without esophagitis: Secondary | ICD-10-CM

## 2018-09-04 MED ORDER — MONTELUKAST SODIUM 10 MG PO TABS: 10.0000 mg | ORAL_TABLET | Freq: Every day | ORAL | 5 refills | Status: AC

## 2018-09-04 MED ORDER — PANTOPRAZOLE SODIUM 40 MG PO TBEC: 40.0000 mg | DELAYED_RELEASE_TABLET | Freq: Two times a day (BID) | ORAL | 3 refills | Status: AC

## 2018-09-04 MED ORDER — FAMOTIDINE 20 MG PO TABS: 20.0000 mg | ORAL_TABLET | Freq: Two times a day (BID) | ORAL | 5 refills | Status: DC

## 2018-09-04 MED ORDER — FLUTICASONE PROPIONATE 50 MCG/ACT NA SUSP: 1.0000 | Freq: Two times a day (BID) | NASAL | 5 refills | Status: AC

## 2018-09-04 MED ORDER — AZELASTINE HCL 0.1 % NA SOLN: NASAL | 5 refills | Status: AC

## 2018-09-04 NOTE — Patient Instructions (Addendum)
Today's skin testing showed: Positive to grass, weed, ragweed, trees.  Start montelukast 10mg  daily. Start azelastine 1-2 spray twice a day as needed for drainage.  Continue Flonase 1 spray twice a day for nasal congestion.   Today's breathing test was normal. Monitor symptoms.  Refer to GI  Increase pepcid to 20mg  twice a day. Increase protonix 40mg  twice a day - take 30 minutes before meals.   Start reflux modification lifestyle - read handouts given in [place]  Follow up in 2 months  Reducing Pollen Exposure . Pollen seasons: trees (spring), grass (summer) and ragweed/weeds (fall). Marland Kitchen Keep windows closed in your home and car to lower pollen exposure.  Lilian Kapur air conditioning in the bedroom and throughout the house if possible.  . Avoid going out in dry windy days - especially early morning. . Pollen counts are highest between 5 - 10 AM and on dry, hot and windy days.  . Save outside activities for late afternoon or after a heavy rain, when pollen levels are lower.  . Avoid mowing of grass if you have grass pollen allergy. Marland Kitchen Be aware that pollen can also be transported indoors on people and pets.  . Dry your clothes in an automatic dryer rather than hanging them outside where they might collect pollen.  . Rinse hair and eyes before bedtime.

## 2018-09-04 NOTE — Assessment & Plan Note (Signed)
Coughing with the phlegm which sometimes lead to SOB and chest tightness. Had inhaler over 10 years ago but no diagnosis of asthma.  Today's spirometry was normal.  Monitor symptoms for now.

## 2018-09-04 NOTE — Assessment & Plan Note (Signed)
Complaining of increased phlegm in the throat after oily foods and in the mornings. Diagnosed with reflux over 5 years ago per patient report. Currently on Protonix 40mg  daily and Pepcid 20mg  daily.  Refer to GI for possible repeat EGD.   Increase Pepcid to 20mg  twice a day.  Increase Protonix 40mg  twice a day.  Start reflux modification lifestyle - handouts given in [place]

## 2018-09-04 NOTE — Assessment & Plan Note (Addendum)
Rhinitis symptoms with PND, nasal congestion and rhinorrhea for the past few years. Worse during the spring. Tried zyrtec and Flonase with some benefit.  Today's skin testing showed: Positive to grass, weed, ragweed, trees. Discussed environmental control measures.  Start montelukast 10mg  daily.  Start azelastine 1-2 spray twice a day as needed for drainage.   Continue Flonase 1 spray twice a day for nasal congestion.   Stop zyrtec as she is complaining of oral mucosa dryness.  Handout given in [place] regarding allergic rhinitis.

## 2018-09-04 NOTE — Progress Notes (Signed)
New Patient Note  RE: Judith Sherman MRN: [id number] DOB: [birth year] Date of Office Visit: 09/04/2018  Referring provider: Doristine BosworthStallings, Zoe A, MD Primary care provider: Patient, No Pcp Per  Chief Complaint: Nasal Congestion (post nasal drip)  History of Present Illness: I had the pleasure of seeing Judith Sherman for initial evaluation at the [facility] on 09/04/2018. She is a 63 y.o. female, who is referred here by Dr. Creta LevinStallings for the evaluation of nasal congestion. She is accompanied today by her vietnamese interpreter who translated for patient.    She reports symptoms of nasal congestion, PND, rhinorrhea. Symptoms have been going on for several years. The symptoms are during the spring. Anosmia: no. Headache: yes. She has used zyrtec and Flonase with some improvement in symptoms. Sinus infections: no. Previous work up includes: none. Previous ENT evaluation: yes but not recently - exam was unremarkable per patient report.  She also complains of some mucosal dryness.   Oily foods cause phlegm in her throat. She is taking Protonix 40mg  daily and Pepcid 20mg  daily with some benefit. Sometimes it also occurs in the morning. She had EGD more than 5 years ago which confirmed reflux per patient report.   Dietary History: patient has been eating other foods including limited milk, eggs, peanut, treenuts, sesame, shellfish, seafood, soy, wheat, meats, fruits and vegetables.  Assessment and Plan: [name] is a 63 y.o. female with: Other allergic rhinitis Rhinitis symptoms with PND, nasal congestion and rhinorrhea for the past few years. Worse during the spring. Tried zyrtec and Flonase with some benefit.  Today's skin testing showed: Positive to grass, weed, ragweed, trees. Discussed environmental control measures.  Start montelukast 10mg  daily.  Start azelastine 1-2 spray twice a day as needed for drainage.   Continue Flonase 1 spray twice a day for nasal congestion.    Stop zyrtec as she is complaining of oral mucosa dryness.  Handout given in [place] regarding allergic rhinitis.   Gastroesophageal reflux disease Complaining of increased phlegm in the throat after oily foods and in the mornings. Diagnosed with reflux over 5 years ago per patient report. Currently on Protonix 40mg  daily and Pepcid 20mg  daily.  Refer to GI for possible repeat EGD.   Increase Pepcid to 20mg  twice a day.  Increase Protonix 40mg  twice a day.  Start reflux modification lifestyle - handouts given in [place].  Coughing Coughing with the phlegm which sometimes lead to SOB and chest tightness. Had inhaler over 10 years ago but no diagnosis of asthma.  Today's spirometry was normal.  Monitor symptoms for now.  Return in about 2 months (around 11/03/2018).  Other allergy screening: Asthma:   Coughing with phlegm at times which causes shortness of breath and chest tightness. Patient had inhaler about 10 years ago with some benefit.  Rhino conjunctivitis: yes Medication allergy:   Aspirin - upset stomach, diarrhea Hymenoptera allergy: no Urticaria: no Eczema:no History of recurrent infections suggestive of immunodeficency: no  Diagnostics: Spirometry:  Tracings reviewed. Her effort: Good reproducible efforts. FVC: 2.05L FEV1: 1.64L, 85% predicted FEV1/FVC ratio: 80% Interpretation: Spirometry consistent with normal pattern.  Please see scanned spirometry results for details.  Skin Testing: Environmental allergy panel and basic foods. Positive test to: grass, tree, ragweed, weed.  Results discussed with patient/family. Airborne Adult Perc - 09/04/18 1452    Time Antigen Placed  1452    Allergen Manufacturer  Waynette ButteryGreer    Location  Back    Number of Test  59  Panel 1  Select    1. Control-Buffer 50% Glycerol  Negative    2. Control-Histamine 1 mg/ml  2+    3. Albumin saline  Negative    4. Bahia  Negative    5. [place]  Negative    6. Johnson   Negative    7. Kentucky Blue  Negative    8. Meadow Fescue  Negative    9. Perennial Rye  4+    10. Sweet Vernal  Negative    11. Timothy  Negative    12. Cocklebur  4+    13. Burweed Marshelder  4+    14. Ragweed, short  4+    15. Ragweed, Giant  4+    16. Plantain,  English  Negative    17. Lamb's Quarters  Negative    18. Sheep Sorrell  Negative    19. Rough Pigweed  2+    20. Marsh Elder, Rough  2+    21. Mugwort, Common  Negative    22. Ash mix  Negative    23. Birch mix  Negative    24. Beech American  Negative    25. Box, Elder  Negative    26. Cedar, red  Negative    27. Cottonwood, Eastern  2+    28. Elm mix  Negative    29. Hickory mix  2+    30. Maple mix  Negative    31. Oak, Guinea-Bissau mix  Negative    32. Pecan Pollen  Negative    33. Pine mix  Negative    34. Sycamore Eastern  Negative    35. Walnut, Black Pollen  Negative    36. Alternaria alternata  Negative    37. Cladosporium Herbarum  Negative    38. Aspergillus mix  Negative    39. Penicillium mix  Negative    40. Bipolaris sorokiniana (Helminthosporium)  Negative    41. Drechslera spicifera (Curvularia)  Negative    42. Mucor plumbeus  Negative    43. Fusarium moniliforme  Negative    44. Aureobasidium pullulans (pullulara)  Negative    45. Rhizopus oryzae  Negative    46. Botrytis cinera  Negative    47. Epicoccum nigrum  Negative    48. Phoma betae  Negative    49. Candida Albicans  Negative    50. Trichophyton mentagrophytes  Negative    51. Mite, D Farinae  5,000 AU/ml  Negative    52. Mite, D Pteronyssinus  5,000 AU/ml  Negative    53. Cat Hair 10,000 BAU/ml  Negative    54.  Dog Epithelia  Negative    55. Mixed Feathers  Negative    56. Horse Epithelia  Negative    57. Cockroach, German  Negative    58. Mouse  Negative    59. Tobacco Leaf  Negative     Food Perc - 09/04/18 1452    Time Antigen Placed  1452    Allergen Manufacturer  Waynette Buttery    Location  Back    Number of allergen test   10    Food  Select    1. Peanut  Negative    2. Soybean food  Negative    3. Wheat, whole  Negative    4. Sesame  Negative    5. Milk, cow  Negative    6. Egg White, chicken  Negative    7. Casein  Negative    8. Shellfish mix  Negative  9. Fish mix  Negative    10. Cashew  Negative     Intradermal - 09/04/18 1557    Time Antigen Placed  1557    Allergen Manufacturer  Waynette Buttery    Location  Arm    Number of Test  12    Intradermal  Select    Control  Negative    [place]  3+    Johnson  3+    Tree mix  4+    Mold 1  Negative    Mold 2  Negative    Mold 3  Negative    Mold 4  Negative    Cat  Negative    Dog  Negative    Cockroach  Negative    Mite mix  Negative       Past Medical History: Patient Active Problem List   Diagnosis Date Noted  . Coughing 09/04/2018  . Hypovitaminosis D 10/03/2016  . Tooth decay 06/07/2016  . Language barrier 02/24/2016  . Vaginal dryness 02/24/2016  . Migraine without aura and responsive to treatment 12/14/2015  . Other allergic rhinitis 12/14/2015  . Degenerative disc disease, lumbar 05/20/2015  . Hyperlipidemia 05/20/2015  . Loss of weight 05/06/2014  . Gastroesophageal reflux disease 05/06/2014  . Dyslipidemia 05/06/2014  . Annual physical exam 01/18/2014  . Establishing care with new doctor, encounter for 01/12/2014   Past Medical History:  Diagnosis Date  . Allergic rhinitis   . Hyperlipidemia   . Hypertension   . LPRD (laryngopharyngeal reflux disease)    Evaluated by [facility]   Past Surgical History: Past Surgical History:  Procedure Laterality Date  . BREAST LUMPECTOMY     benign  . COLONOSCOPY N/A 07/12/2014   SLF:  1. small ulcer in teh terminal ileum 2. Small internal hemorrhoids.  . TONSILLECTOMY     Medication List:  Current Outpatient Medications  Medication Sig Dispense Refill  . acetaminophen (TYLENOL) 500 MG tablet Take 2 tablets (1,000 mg total) by mouth every 8 (eight)  hours as needed. Take 2 tabs every 8 hours. 30 tablet 0  . cetirizine (ZYRTEC) 10 MG tablet Take 1 tablet (10 mg total) by mouth daily. One tab daily for allergies 30 tablet 1  . fluticasone (FLONASE) 50 MCG/ACT nasal spray Place 1 spray into both nostrils 2 (two) times daily. For nasal congestion. 16 g 5  . simvastatin (ZOCOR) 40 MG tablet Take 1 tablet (40 mg total) by mouth at bedtime. 90 tablet 3  . azelastine (ASTELIN) 0.1 % nasal spray Use 1-2 sprays twice a day as needed for nasal drainage. 30 mL 5  . famotidine (PEPCID) 20 MG tablet Take 1 tablet (20 mg total) by mouth 2 (two) times daily. For reflux 60 tablet 5  . montelukast (SINGULAIR) 10 MG tablet Take 1 tablet (10 mg total) by mouth at bedtime. For allergies. 30 tablet 5  . OVER THE COUNTER MEDICATION     . pantoprazole (PROTONIX) 40 MG tablet Take 1 tablet (40 mg total) by mouth 2 (two) times daily. For reflux - take 30 minutes before meals. 60 tablet 3  . prochlorperazine (COMPAZINE) 10 MG tablet Take 1 tablet (10 mg total) by mouth 2 (two) times daily as needed (for headache). (Patient not taking: Reported on 09/04/2018) 10 tablet 0  . SUMAtriptan (IMITREX) 50 MG tablet Take 1 tablet (50 mg total) by mouth every 2 (two) hours as needed for migraine. May repeat in 2 hours if headache persists or recurs. (  Patient not taking: Reported on 09/04/2018) 30 tablet 1   No current facility-administered medications for this visit.    Allergies: Allergies  Allergen Reactions  . Aspirin    Social History: Social History   Socioeconomic History  . Marital status: Married    Spouse name: Not on file  . Number of children: 0  . Years of education: Not on file  . Highest education level: Not on file  Occupational History  . Occupation: unemployed  Social Needs  . Financial resource strain: Not on file  . Food insecurity:    Worry: Not on file    Inability: Not on file  . Transportation needs:    Medical: Not on file    Non-medical:  Not on file  Tobacco Use  . Smoking status: Never Smoker  . Smokeless tobacco: Never Used  Substance and Sexual Activity  . Alcohol use: No    Alcohol/week: 0.0 standard drinks  . Drug use: No  . Sexual activity: Not on file  Lifestyle  . Physical activity:    Days per week: Not on file    Minutes per session: Not on file  . Stress: Not on file  Relationships  . Social connections:    Talks on phone: Not on file    Gets together: Not on file    Attends religious service: Not on file    Active member of club or organization: Not on file    Attends meetings of clubs or organizations: Not on file    Relationship status: Not on file  Other Topics Concern  . Not on file  Social History Narrative  . Not on file   Lives in a [address] year old townhome. Smoking: denies Occupation: retired  Landscape architect HistorySurveyor, minerals in the house: no Engineer, civil (consulting) in the family room: yes Carpet in the bedroom: yes Heating: electric Cooling: central Pet: no  Family History: Family History  Problem Relation Age of Onset  . Hypertension Mother   . Colon cancer Neg Hx   . Asthma Neg Hx   . Allergic rhinitis Neg Hx    Review of Systems  Constitutional: Negative for appetite change, chills, fever and unexpected weight change.  HENT: Positive for congestion and postnasal drip. Negative for rhinorrhea.   Eyes: Negative for itching.  Respiratory: Positive for cough. Negative for chest tightness, shortness of breath and wheezing.   Cardiovascular: Negative for chest pain.  Gastrointestinal: Negative for abdominal pain.  Genitourinary: Negative for difficulty urinating.  Skin: Negative for rash.  Allergic/Immunologic: Negative for environmental allergies and food allergies.  Neurological: Negative for headaches.   Objective: BP 118/74 (BP Location: Left Arm, Patient Position: Sitting, Cuff Size: Normal)   Pulse 81   Temp 98.1 F (36.7 C) (Oral)   Resp 16   Ht 5\' 1"  (1.549 m)   Wt 102  lb 12.8 oz (46.6 kg)   SpO2 97%   BMI 19.42 kg/m  Body mass index is 19.42 kg/m. Physical Exam  Constitutional: She is oriented to person, place, and time. She appears well-developed and well-nourished.  HENT:  Head: Normocephalic and atraumatic.  Right Ear: External ear normal.  Left Ear: External ear normal.  Nose: Nose normal.  Mouth/Throat: Oropharynx is clear and moist.  Eyes: Conjunctivae and EOM are normal.  Neck: Neck supple.  Cardiovascular: Normal rate, regular rhythm and normal heart sounds. Exam reveals no gallop and no friction rub.  No murmur heard. Pulmonary/Chest: Effort normal and breath sounds normal. She has  no wheezes. She has no rales.  Abdominal: Soft. Bowel sounds are normal. There is no abdominal tenderness.  Lymphadenopathy:    She has no cervical adenopathy.  Neurological: She is alert and oriented to person, place, and time.  Skin: Skin is warm. No rash noted.  Psychiatric: She has a normal mood and affect. Her behavior is normal.  Nursing note and vitals reviewed.  The plan was reviewed with the patient/family, and all questions/concerned were addressed.  It was my pleasure to see Fiorela today and participate in her care. Please feel free to contact me with any questions or concerns.  Sincerely,  Wyline Mood, DO Allergy & Immunology  [facility] office: [phone] [facility] office: [phone]

## 2018-09-30 ENCOUNTER — Other Ambulatory Visit: Payer: Self-pay | Admitting: Family Medicine

## 2018-10-01 ENCOUNTER — Other Ambulatory Visit: Payer: Self-pay

## 2018-10-01 ENCOUNTER — Ambulatory Visit: Payer: BLUE CROSS/BLUE SHIELD | Admitting: Family Medicine

## 2018-10-10 ENCOUNTER — Ambulatory Visit: Payer: BLUE CROSS/BLUE SHIELD | Admitting: Family Medicine

## 2018-10-29 ENCOUNTER — Other Ambulatory Visit: Payer: Self-pay | Admitting: Family Medicine

## 2019-10-29 ENCOUNTER — Ambulatory Visit: Payer: BLUE CROSS/BLUE SHIELD | Attending: Internal Medicine

## 2019-10-29 DIAGNOSIS — Z23 Encounter for immunization: Secondary | ICD-10-CM

## 2019-10-29 NOTE — Progress Notes (Signed)
   Covid-19 Vaccination Clinic  Name:  Judith Sherman    MRN: [id number] DOB: [birth year]  10/29/2019  [name] was observed post Covid-19 immunization for 15 minutes without incident. She was provided with Vaccine Information Sheet and instruction to access the V-Safe system.   [name] was instructed to call 911 with any severe reactions post vaccine: Marland Kitchen Difficulty breathing  . Swelling of face and throat  . A fast heartbeat  . A bad rash all over body  . Dizziness and weakness   Immunizations Administered    Name Date Dose VIS Date Route   Pfizer COVID-19 Vaccine 10/29/2019  1:27 PM 0.3 mL 07/03/2019 Intramuscular   Manufacturer: [employer], [employer]   Lot: [id number]   NDC: [address]-5

## 2019-11-23 ENCOUNTER — Ambulatory Visit: Payer: BLUE CROSS/BLUE SHIELD | Attending: Internal Medicine

## 2019-11-23 DIAGNOSIS — Z23 Encounter for immunization: Secondary | ICD-10-CM

## 2019-11-23 NOTE — Progress Notes (Signed)
   Covid-19 Vaccination Clinic  Name:  Judith Sherman    MRN: [id number] DOB: 1955-10-23  11/23/2019  [name] was observed post Covid-19 immunization for 15 minutes without incident. She was provided with Vaccine Information Sheet and instruction to access the V-Safe system.   [name] was instructed to call 911 with any severe reactions post vaccine: Marland Kitchen Difficulty breathing  . Swelling of face and throat  . A fast heartbeat  . A bad rash all over body  . Dizziness and weakness   Immunizations Administered    Name Date Dose VIS Date Route   Pfizer COVID-19 Vaccine 11/23/2019  9:18 AM 0.3 mL 09/16/2018 Intramuscular   Manufacturer: [employer], [employer]   Lot: [id number]   NDC: [address]-3

## 2020-09-29 DIAGNOSIS — E785 Hyperlipidemia, unspecified: Secondary | ICD-10-CM | POA: Diagnosis not present

## 2020-09-29 DIAGNOSIS — M62838 Other muscle spasm: Secondary | ICD-10-CM | POA: Diagnosis not present

## 2020-09-29 DIAGNOSIS — J309 Allergic rhinitis, unspecified: Secondary | ICD-10-CM | POA: Diagnosis not present

## 2020-09-29 DIAGNOSIS — K219 Gastro-esophageal reflux disease without esophagitis: Secondary | ICD-10-CM | POA: Diagnosis not present

## 2020-12-26 DIAGNOSIS — K219 Gastro-esophageal reflux disease without esophagitis: Secondary | ICD-10-CM | POA: Diagnosis not present

## 2020-12-26 DIAGNOSIS — J309 Allergic rhinitis, unspecified: Secondary | ICD-10-CM | POA: Diagnosis not present

## 2020-12-26 DIAGNOSIS — E785 Hyperlipidemia, unspecified: Secondary | ICD-10-CM | POA: Diagnosis not present

## 2021-01-18 DIAGNOSIS — E785 Hyperlipidemia, unspecified: Secondary | ICD-10-CM | POA: Diagnosis not present

## 2021-01-18 DIAGNOSIS — K219 Gastro-esophageal reflux disease without esophagitis: Secondary | ICD-10-CM | POA: Diagnosis not present

## 2021-01-18 DIAGNOSIS — R1013 Epigastric pain: Secondary | ICD-10-CM | POA: Diagnosis not present

## 2021-02-10 ENCOUNTER — Other Ambulatory Visit: Payer: Self-pay | Admitting: Physician Assistant

## 2021-02-10 DIAGNOSIS — R1110 Vomiting, unspecified: Secondary | ICD-10-CM | POA: Diagnosis not present

## 2021-02-10 DIAGNOSIS — K219 Gastro-esophageal reflux disease without esophagitis: Secondary | ICD-10-CM | POA: Diagnosis not present

## 2021-02-17 ENCOUNTER — Ambulatory Visit
Admission: RE | Admit: 2021-02-17 | Discharge: 2021-02-17 | Disposition: A | Discharge status: Home / Self Care | Payer: BLUE CROSS/BLUE SHIELD | Source: Ambulatory Visit | Attending: Physician Assistant | Admitting: Physician Assistant

## 2021-02-17 DIAGNOSIS — K219 Gastro-esophageal reflux disease without esophagitis: Secondary | ICD-10-CM

## 2021-02-17 DIAGNOSIS — R1110 Vomiting, unspecified: Secondary | ICD-10-CM

## 2021-03-06 ENCOUNTER — Other Ambulatory Visit (HOSPITAL_COMMUNITY)
Admission: RE | Admit: 2021-03-06 | Discharge: 2021-03-06 | Disposition: A | Discharge status: Home / Self Care | Payer: PPO | Source: Ambulatory Visit | Attending: Family Medicine | Admitting: Family Medicine

## 2021-03-06 DIAGNOSIS — Z01419 Encounter for gynecological examination (general) (routine) without abnormal findings: Secondary | ICD-10-CM | POA: Insufficient documentation

## 2021-03-06 DIAGNOSIS — Z23 Encounter for immunization: Secondary | ICD-10-CM | POA: Diagnosis not present

## 2021-03-06 DIAGNOSIS — E785 Hyperlipidemia, unspecified: Secondary | ICD-10-CM | POA: Diagnosis not present

## 2021-03-06 DIAGNOSIS — Z0000 Encounter for general adult medical examination without abnormal findings: Secondary | ICD-10-CM | POA: Diagnosis not present

## 2021-03-06 DIAGNOSIS — Z1151 Encounter for screening for human papillomavirus (HPV): Secondary | ICD-10-CM | POA: Diagnosis not present

## 2021-03-06 DIAGNOSIS — Z1159 Encounter for screening for other viral diseases: Secondary | ICD-10-CM | POA: Diagnosis not present

## 2021-03-06 DIAGNOSIS — Z1231 Encounter for screening mammogram for malignant neoplasm of breast: Secondary | ICD-10-CM | POA: Diagnosis not present

## 2021-03-06 DIAGNOSIS — Z124 Encounter for screening for malignant neoplasm of cervix: Secondary | ICD-10-CM | POA: Insufficient documentation

## 2021-03-08 LAB — CYTOLOGY - PAP
Comment: NEGATIVE
Diagnosis: NEGATIVE
High risk HPV: NEGATIVE

## 2021-03-29 DIAGNOSIS — R1110 Vomiting, unspecified: Secondary | ICD-10-CM | POA: Diagnosis not present

## 2021-03-29 DIAGNOSIS — K219 Gastro-esophageal reflux disease without esophagitis: Secondary | ICD-10-CM | POA: Diagnosis not present

## 2021-04-04 DIAGNOSIS — Z1231 Encounter for screening mammogram for malignant neoplasm of breast: Secondary | ICD-10-CM | POA: Diagnosis not present

## 2021-04-04 DIAGNOSIS — E785 Hyperlipidemia, unspecified: Secondary | ICD-10-CM | POA: Diagnosis not present

## 2021-05-22 DIAGNOSIS — K219 Gastro-esophageal reflux disease without esophagitis: Secondary | ICD-10-CM | POA: Diagnosis not present

## 2021-05-22 DIAGNOSIS — Z23 Encounter for immunization: Secondary | ICD-10-CM | POA: Diagnosis not present

## 2021-05-22 DIAGNOSIS — R0981 Nasal congestion: Secondary | ICD-10-CM | POA: Diagnosis not present

## 2021-05-25 DIAGNOSIS — R1110 Vomiting, unspecified: Secondary | ICD-10-CM | POA: Diagnosis not present

## 2021-05-25 DIAGNOSIS — K219 Gastro-esophageal reflux disease without esophagitis: Secondary | ICD-10-CM | POA: Diagnosis not present

## 2021-05-25 DIAGNOSIS — K2930 Chronic superficial gastritis without bleeding: Secondary | ICD-10-CM | POA: Diagnosis not present

## 2021-05-31 DIAGNOSIS — K2930 Chronic superficial gastritis without bleeding: Secondary | ICD-10-CM | POA: Diagnosis not present

## 2021-07-27 DIAGNOSIS — J069 Acute upper respiratory infection, unspecified: Secondary | ICD-10-CM | POA: Diagnosis not present

## 2021-07-27 DIAGNOSIS — R059 Cough, unspecified: Secondary | ICD-10-CM | POA: Diagnosis not present

## 2021-07-27 DIAGNOSIS — R509 Fever, unspecified: Secondary | ICD-10-CM | POA: Diagnosis not present

## 2021-09-01 DIAGNOSIS — M546 Pain in thoracic spine: Secondary | ICD-10-CM | POA: Diagnosis not present

## 2021-09-01 DIAGNOSIS — K219 Gastro-esophageal reflux disease without esophagitis: Secondary | ICD-10-CM | POA: Diagnosis not present

## 2021-09-01 DIAGNOSIS — R059 Cough, unspecified: Secondary | ICD-10-CM | POA: Diagnosis not present

## 2022-04-19 DIAGNOSIS — K219 Gastro-esophageal reflux disease without esophagitis: Secondary | ICD-10-CM | POA: Diagnosis not present

## 2022-04-19 DIAGNOSIS — R109 Unspecified abdominal pain: Secondary | ICD-10-CM | POA: Diagnosis not present

## 2022-04-19 DIAGNOSIS — E2839 Other primary ovarian failure: Secondary | ICD-10-CM | POA: Diagnosis not present

## 2022-04-19 DIAGNOSIS — Z124 Encounter for screening for malignant neoplasm of cervix: Secondary | ICD-10-CM | POA: Diagnosis not present

## 2022-04-19 DIAGNOSIS — J309 Allergic rhinitis, unspecified: Secondary | ICD-10-CM | POA: Diagnosis not present

## 2022-04-19 DIAGNOSIS — Z0000 Encounter for general adult medical examination without abnormal findings: Secondary | ICD-10-CM | POA: Diagnosis not present

## 2022-04-19 DIAGNOSIS — R3129 Other microscopic hematuria: Secondary | ICD-10-CM | POA: Diagnosis not present

## 2022-04-19 DIAGNOSIS — K2930 Chronic superficial gastritis without bleeding: Secondary | ICD-10-CM | POA: Diagnosis not present

## 2022-04-19 DIAGNOSIS — E785 Hyperlipidemia, unspecified: Secondary | ICD-10-CM | POA: Diagnosis not present

## 2022-04-20 ENCOUNTER — Other Ambulatory Visit: Payer: Self-pay | Admitting: Family Medicine

## 2022-04-20 DIAGNOSIS — E785 Hyperlipidemia, unspecified: Secondary | ICD-10-CM | POA: Diagnosis not present

## 2022-04-20 DIAGNOSIS — E2839 Other primary ovarian failure: Secondary | ICD-10-CM

## 2022-06-27 DIAGNOSIS — E785 Hyperlipidemia, unspecified: Secondary | ICD-10-CM | POA: Diagnosis not present

## 2022-06-27 DIAGNOSIS — R3129 Other microscopic hematuria: Secondary | ICD-10-CM | POA: Diagnosis not present

## 2022-08-09 DIAGNOSIS — M62838 Other muscle spasm: Secondary | ICD-10-CM | POA: Diagnosis not present

## 2022-08-09 DIAGNOSIS — H5713 Ocular pain, bilateral: Secondary | ICD-10-CM | POA: Diagnosis not present

## 2022-08-09 DIAGNOSIS — Z789 Other specified health status: Secondary | ICD-10-CM | POA: Diagnosis not present

## 2022-08-09 DIAGNOSIS — K219 Gastro-esophageal reflux disease without esophagitis: Secondary | ICD-10-CM | POA: Diagnosis not present

## 2022-09-06 ENCOUNTER — Other Ambulatory Visit: Payer: Self-pay | Admitting: Family Medicine

## 2022-09-06 DIAGNOSIS — Z1231 Encounter for screening mammogram for malignant neoplasm of breast: Secondary | ICD-10-CM

## 2022-09-10 DIAGNOSIS — Z79899 Other long term (current) drug therapy: Secondary | ICD-10-CM | POA: Diagnosis not present

## 2022-09-14 DIAGNOSIS — K5900 Constipation, unspecified: Secondary | ICD-10-CM | POA: Diagnosis not present

## 2022-09-21 ENCOUNTER — Ambulatory Visit: Payer: PPO

## 2022-10-15 ENCOUNTER — Telehealth: Payer: Self-pay

## 2022-10-30 ENCOUNTER — Ambulatory Visit: Payer: PPO

## 2022-11-02 NOTE — Telephone Encounter (Signed)
Closing encounter

## 2022-11-06 ENCOUNTER — Ambulatory Visit
Admission: RE | Admit: 2022-11-06 | Discharge: 2022-11-06 | Disposition: A | Discharge status: Home / Self Care | Payer: PPO | Source: Ambulatory Visit | Attending: Family Medicine | Admitting: Family Medicine

## 2022-11-06 DIAGNOSIS — Z1231 Encounter for screening mammogram for malignant neoplasm of breast: Secondary | ICD-10-CM

## 2022-11-20 DIAGNOSIS — N898 Other specified noninflammatory disorders of vagina: Secondary | ICD-10-CM | POA: Diagnosis not present

## 2022-11-20 DIAGNOSIS — R350 Frequency of micturition: Secondary | ICD-10-CM | POA: Diagnosis not present

## 2022-11-20 DIAGNOSIS — J309 Allergic rhinitis, unspecified: Secondary | ICD-10-CM | POA: Diagnosis not present

## 2022-11-20 DIAGNOSIS — R9230 Dense breasts, unspecified: Secondary | ICD-10-CM | POA: Diagnosis not present

## 2022-11-20 DIAGNOSIS — M542 Cervicalgia: Secondary | ICD-10-CM | POA: Diagnosis not present

## 2022-11-29 DIAGNOSIS — R21 Rash and other nonspecific skin eruption: Secondary | ICD-10-CM | POA: Diagnosis not present

## 2022-12-13 DIAGNOSIS — M542 Cervicalgia: Secondary | ICD-10-CM | POA: Diagnosis not present

## 2022-12-14 DIAGNOSIS — L989 Disorder of the skin and subcutaneous tissue, unspecified: Secondary | ICD-10-CM | POA: Diagnosis not present

## 2022-12-14 DIAGNOSIS — Z789 Other specified health status: Secondary | ICD-10-CM | POA: Diagnosis not present

## 2022-12-14 DIAGNOSIS — Z205 Contact with and (suspected) exposure to viral hepatitis: Secondary | ICD-10-CM | POA: Diagnosis not present

## 2022-12-29 ENCOUNTER — Emergency Department (HOSPITAL_COMMUNITY)
Admission: EM | Admit: 2022-12-29 | Discharge: 2022-12-29 | Disposition: A | Discharge status: Home / Self Care | Payer: PPO | Attending: Emergency Medicine | Admitting: Emergency Medicine

## 2022-12-29 ENCOUNTER — Other Ambulatory Visit: Payer: Self-pay

## 2022-12-29 DIAGNOSIS — I10 Essential (primary) hypertension: Secondary | ICD-10-CM | POA: Insufficient documentation

## 2022-12-29 DIAGNOSIS — Z79899 Other long term (current) drug therapy: Secondary | ICD-10-CM | POA: Diagnosis not present

## 2022-12-29 DIAGNOSIS — R110 Nausea: Secondary | ICD-10-CM | POA: Diagnosis not present

## 2022-12-29 DIAGNOSIS — R42 Dizziness and giddiness: Secondary | ICD-10-CM | POA: Diagnosis not present

## 2022-12-29 DIAGNOSIS — R519 Headache, unspecified: Secondary | ICD-10-CM | POA: Diagnosis present

## 2022-12-29 DIAGNOSIS — G43909 Migraine, unspecified, not intractable, without status migrainosus: Secondary | ICD-10-CM

## 2022-12-29 LAB — CBC
HCT: 39.3 % (ref 36.0–46.0)
Hemoglobin: 13.1 g/dL (ref 12.0–15.0)
MCH: 30.6 pg (ref 26.0–34.0)
MCHC: 33.3 g/dL (ref 30.0–36.0)
MCV: 91.8 fL (ref 80.0–100.0)
Platelets: 279 10*3/uL (ref 150–400)
RBC: 4.28 MIL/uL (ref 3.87–5.11)
RDW: 12.2 % (ref 11.5–15.5)
WBC: 7.3 10*3/uL (ref 4.0–10.5)
nRBC: 0 % (ref 0.0–0.2)

## 2022-12-29 LAB — BASIC METABOLIC PANEL
Anion gap: 9 (ref 5–15)
BUN: 13 mg/dL (ref 8–23)
CO2: 25 mmol/L (ref 22–32)
Calcium: 9 mg/dL (ref 8.9–10.3)
Chloride: 104 mmol/L (ref 98–111)
Creatinine, Ser: 0.57 mg/dL (ref 0.44–1.00)
GFR, Estimated: >60 mL/min (ref >60)
Glucose, Bld: 136 mg/dL — ABNORMAL HIGH (ref 70–99)
Potassium: 3.7 mmol/L (ref 3.5–5.1)
Sodium: 138 mmol/L (ref 135–145)

## 2022-12-29 MED ORDER — DEXAMETHASONE SODIUM PHOSPHATE 10 MG/ML IJ SOLN
10.0000 mg | Freq: Once | INTRAMUSCULAR | Status: AC
  Administered 2022-12-29: 10 mg via INTRAVENOUS
  Filled 2022-12-29: qty 1

## 2022-12-29 MED ORDER — DIPHENHYDRAMINE HCL 50 MG/ML IJ SOLN
25.0000 mg | Freq: Once | INTRAMUSCULAR | Status: AC
  Administered 2022-12-29: 25 mg via INTRAVENOUS
  Filled 2022-12-29: qty 1

## 2022-12-29 MED ORDER — SUMATRIPTAN SUCCINATE 50 MG PO TABS: 50.0000 mg | ORAL_TABLET | ORAL | 1 refills | Status: DC | PRN

## 2022-12-29 MED ORDER — MAGNESIUM SULFATE IN D5W 1-5 GM/100ML-% IV SOLN
1.0000 g | Freq: Once | INTRAVENOUS | Status: AC
  Administered 2022-12-29: 1 g via INTRAVENOUS
  Filled 2022-12-29: qty 100

## 2022-12-29 MED ORDER — SODIUM CHLORIDE 0.9 % IV BOLUS
1000.0000 mL | Freq: Once | INTRAVENOUS | Status: AC
  Administered 2022-12-29: 1000 mL via INTRAVENOUS

## 2022-12-29 MED ORDER — PROCHLORPERAZINE MALEATE 10 MG PO TABS: 10.0000 mg | ORAL_TABLET | Freq: Two times a day (BID) | ORAL | 0 refills | Status: AC | PRN

## 2022-12-29 MED ORDER — PROCHLORPERAZINE EDISYLATE 10 MG/2ML IJ SOLN
10.0000 mg | Freq: Once | INTRAMUSCULAR | Status: AC
  Administered 2022-12-29: 10 mg via INTRAVENOUS
  Filled 2022-12-29: qty 2

## 2022-12-29 NOTE — ED Triage Notes (Signed)
EMS reports from home called for c/o migraine since Tuesday. Taken Ibuprofen without relief. C/o of nausea today.  BP 142/98 HR 64 RR 16 Sp02 98 RA  20ga RAC 4mg  Zofran enroute

## 2022-12-29 NOTE — Discharge Instructions (Addendum)
As we discussed, your workup in the ER today was reassuring for acute findings.  Laboratory evaluation did not reveal any emergent concerns.  Given that you are feeling better after medications, no intervention is indicated.  I recommend that you call your PCP and schedule a close follow-up as well as your neurologist.  I have given you a prescription for Imitrex which is a migraine medication for you to take as prescribed as needed.  I have also given you Compazine that you can take for nausea.  Do not drive or operate heavy machinery while taking this medication as it can be sedating.  Return if development of any new or worsening symptoms.

## 2022-12-29 NOTE — ED Provider Notes (Signed)
[facility] AT [facility] Provider Note   CSN: [id number] Arrival date & time: 12/29/22  1006     History  Chief Complaint  Patient presents with   Headache   Nausea    Judith Sherman is a 67 y.o. female.  Patient with history of migraine, hypertension, and hyperlipidemia presents today with complaints of headache.  She states that same came on gradually throughout the day on Tuesday and has been persistent since then.  She has been taking ibuprofen with some relief.  States that today she has had nausea and a few episodes of vomiting and presents with concern for same.  She has well-documented history of migraines and has had an MRI and 2 CT scans previously that have been normal.  She is also seeing a neurologist.  She states that her pain is left-sided in nature and is exactly the same as her previous migraines.  Denies vision changes, neck pain, neck stiffness, fevers, chills.  Denies photophobia or phonophobia.  The history is provided by the patient. The history is limited by a language barrier. A language interpreter was used.  Headache      Home Medications Prior to Admission medications   Medication Sig Start Date End Date Taking? Authorizing Provider  acetaminophen (TYLENOL) 500 MG tablet Take 2 tablets (1,000 mg total) by mouth every 8 (eight) hours as needed. Take 2 tabs every 8 hours. 06/07/16   Quentin Angst, MD  azelastine (ASTELIN) 0.1 % nasal spray Use 1-2 sprays twice a day as needed for nasal drainage. 09/04/18   Ellamae Sia, DO  cetirizine (ZYRTEC) 10 MG tablet Take 1 tablet (10 mg total) by mouth daily. One tab daily for allergies 12/06/15   Linna Hoff, MD  famotidine (PEPCID) 20 MG tablet TAKE 1 TABLET BY MOUTH EVERYDAY AT BEDTIME 10/29/18   Collie Siad A, MD  fluticasone (FLONASE) 50 MCG/ACT nasal spray Place 1 spray into both nostrils 2 (two) times daily. For nasal congestion. 09/04/18   Ellamae Sia, DO  montelukast  (SINGULAIR) 10 MG tablet Take 1 tablet (10 mg total) by mouth at bedtime. For allergies. 09/04/18   Ellamae Sia, DO  OVER THE COUNTER MEDICATION     [provider]  pantoprazole (PROTONIX) 40 MG tablet Take 1 tablet (40 mg total) by mouth 2 (two) times daily. For reflux - take 30 minutes before meals. 09/04/18   Ellamae Sia, DO  prochlorperazine (COMPAZINE) 10 MG tablet Take 1 tablet (10 mg total) by mouth 2 (two) times daily as needed (for headache). Patient not taking: Reported on 09/04/2018 12/24/15   Margarita Grizzle, MD  simvastatin (ZOCOR) 40 MG tablet Take 1 tablet (40 mg total) by mouth at bedtime. 12/19/17   Doristine Bosworth, MD  SUMAtriptan (IMITREX) 50 MG tablet Take 1 tablet (50 mg total) by mouth every 2 (two) hours as needed for migraine. May repeat in 2 hours if headache persists or recurs. Patient not taking: Reported on 09/04/2018 01/17/16   Pete Glatter, MD      Allergies    Aspirin    Review of Systems   Review of Systems  Neurological:  Positive for headaches.  All other systems reviewed and are negative.   Physical Exam Updated Vital Signs BP 137/62   Pulse 82   Temp 97.9 F (36.6 C) (Oral)   Resp 16   SpO2 98%  Physical Exam Vitals and nursing note reviewed.  Constitutional:  General: She is not in acute distress.    Appearance: Normal appearance. She is normal weight. She is not ill-appearing, toxic-appearing or diaphoretic.  HENT:     Head: Normocephalic and atraumatic.  Neck:     Comments: No meningismus Cardiovascular:     Rate and Rhythm: Normal rate.  Pulmonary:     Effort: Pulmonary effort is normal. No respiratory distress.  Musculoskeletal:        General: Normal range of motion.     Cervical back: Normal range of motion.  Skin:    General: Skin is warm and dry.  Neurological:     General: No focal deficit present.     Mental Status: She is alert and oriented to person, place, and time.     GCS: GCS eye subscore is 4. GCS verbal  subscore is 5. GCS motor subscore is 6.     Sensory: Sensation is intact.     Motor: Motor function is intact.     Coordination: Coordination is intact.     Gait: Gait is intact.     Comments: Alert and oriented to self, place, time and event.    Speech is fluent, clear without dysarthria or dysphasia.    Strength 5/5 in upper/lower extremities   Sensation intact in upper/lower extremities    CN I not tested  CN II grossly intact visual fields bilaterally. Did not visualize posterior eye.  CN III, IV, VI PERRLA and EOMs intact bilaterally  CN V Intact sensation to sharp and light touch to the face  CN VII facial movements symmetric  CN VIII not tested  CN IX, X no uvula deviation, symmetric rise of soft palate  CN XI 5/5 SCM and trapezius strength bilaterally  CN XII Midline tongue protrusion, symmetric L/R movements   Psychiatric:        Mood and Affect: Mood normal.        Behavior: Behavior normal.     ED Results / Procedures / Treatments   Labs (all labs ordered are listed, but only abnormal results are displayed) Labs Reviewed  BASIC METABOLIC PANEL - Abnormal; Notable for the following components:      Result Value   Glucose, Bld 136 (*)    All other components within normal limits  CBC    EKG None  Radiology No results found.  Procedures Procedures    Medications Ordered in ED Medications  prochlorperazine (COMPAZINE) injection 10 mg (10 mg Intravenous Given 12/29/22 1211)  diphenhydrAMINE (BENADRYL) injection 25 mg (25 mg Intravenous Given 12/29/22 1211)  dexamethasone (DECADRON) injection 10 mg (10 mg Intravenous Given 12/29/22 1211)  magnesium sulfate IVPB 1 g 100 mL (0 g Intravenous Stopped 12/29/22 1316)  sodium chloride 0.9 % bolus 1,000 mL (0 mLs Intravenous Stopped 12/29/22 1316)    ED Course/ Medical Decision Making/ A&P                             Medical Decision Making Amount and/or Complexity of Data Reviewed Labs:  ordered.  Risk Prescription drug management.   This patient is a 67 y.o. female who presents to the ED for concern of headache, this involves an extensive number of treatment options, and is a complaint that carries with it a high risk of complications and morbidity. The emergent differential diagnosis prior to evaluation includes, but is not limited to,  Stroke, increased ICP, meningitis, CVA, intracranial tumor, venous sinus thrombosis, migraine, cluster headache,  hypertension, drug related, head injury, tension headache, sinusitis, dental abscess, otitis media, TMJ, glaucoma, trigeminal neuralgia.  This is not an exhaustive differential.   Past Medical History / Co-morbidities / Social History: history of migraine, hypertension, and hyperlipidemia  Additional history: Chart reviewed. Pertinent results include: Patient has had a negative MRI and CT scan in 2017  Physical Exam: Physical exam performed. The pertinent findings include: Well-appearing, alert and oriented and neurologically intact without focal deficits.  Lab Tests: I ordered, and personally interpreted labs.  The pertinent results include: No pertinent laboratory abnormalities   Medications: I ordered medication including fluids, Decadron, Benadryl, Compazine, magnesium for migraine. Reevaluation of the patient after these medicines showed that the patient resolved. I have reviewed the patients home medicines and have made adjustments as needed.   Disposition: After consideration of the diagnostic results and the patients response to treatment, I feel that emergency department workup does not suggest an emergent condition requiring admission or immediate intervention beyond what has been performed at this time. The plan is: Discharge with close outpatient follow-up.  I did offer a repeat CT scan for headaches as it has been several years since he had 1 previously.  Shared decision making, given the patient is feeling better  after medication and she does not have any neurologic deficits and her symptoms are exactly the same as her chronic migraines, she will defer any additional imaging.  Will send for Imitrex and Compazine which she has been on previously for her migraines. Evaluation and diagnostic testing in the emergency department does not suggest an emergent condition requiring admission or immediate intervention beyond what has been performed at this time.  Plan for discharge with close PCP and neurology follow-up.  Patient is understanding and amenable with plan, educated on red flag symptoms that would prompt immediate return.  Patient discharged in stable condition.  Findings and plan of care discussed with supervising physician Dr. Fredderick Phenix who is in agreement.    Final Clinical Impression(s) / ED Diagnoses Final diagnoses:  Migraine without status migrainosus, not intractable, unspecified migraine type    Rx / DC Orders ED Discharge Orders          Ordered    SUMAtriptan (IMITREX) 50 MG tablet  Every 2 hours PRN        12/29/22 1514    prochlorperazine (COMPAZINE) 10 MG tablet  2 times daily PRN        12/29/22 1514          An After Visit Summary was printed and given to the patient.     Vear Clock 12/29/22 1515    Rolan Bucco, MD 12/29/22 [id number]

## 2023-01-04 DIAGNOSIS — G479 Sleep disorder, unspecified: Secondary | ICD-10-CM | POA: Diagnosis not present

## 2023-01-04 DIAGNOSIS — G43909 Migraine, unspecified, not intractable, without status migrainosus: Secondary | ICD-10-CM | POA: Diagnosis not present

## 2023-01-04 DIAGNOSIS — J309 Allergic rhinitis, unspecified: Secondary | ICD-10-CM | POA: Diagnosis not present

## 2023-01-14 DIAGNOSIS — M542 Cervicalgia: Secondary | ICD-10-CM | POA: Diagnosis not present

## 2023-01-16 DIAGNOSIS — M542 Cervicalgia: Secondary | ICD-10-CM | POA: Diagnosis not present

## 2023-01-28 DIAGNOSIS — M542 Cervicalgia: Secondary | ICD-10-CM | POA: Diagnosis not present

## 2023-01-30 DIAGNOSIS — M542 Cervicalgia: Secondary | ICD-10-CM | POA: Diagnosis not present

## 2023-02-04 DIAGNOSIS — M542 Cervicalgia: Secondary | ICD-10-CM | POA: Diagnosis not present

## 2023-02-06 DIAGNOSIS — M542 Cervicalgia: Secondary | ICD-10-CM | POA: Diagnosis not present

## 2023-02-08 ENCOUNTER — Ambulatory Visit
Admission: RE | Admit: 2023-02-08 | Discharge: 2023-02-08 | Disposition: A | Discharge status: Home / Self Care | Payer: PPO | Source: Ambulatory Visit | Attending: Family Medicine | Admitting: Family Medicine

## 2023-02-08 DIAGNOSIS — N950 Postmenopausal bleeding: Secondary | ICD-10-CM | POA: Diagnosis not present

## 2023-02-08 DIAGNOSIS — M8588 Other specified disorders of bone density and structure, other site: Secondary | ICD-10-CM | POA: Diagnosis not present

## 2023-02-08 DIAGNOSIS — E2839 Other primary ovarian failure: Secondary | ICD-10-CM

## 2023-02-08 DIAGNOSIS — E349 Endocrine disorder, unspecified: Secondary | ICD-10-CM | POA: Diagnosis not present

## 2023-02-11 DIAGNOSIS — M542 Cervicalgia: Secondary | ICD-10-CM | POA: Diagnosis not present

## 2023-03-01 DIAGNOSIS — M542 Cervicalgia: Secondary | ICD-10-CM | POA: Diagnosis not present

## 2023-03-04 DIAGNOSIS — M542 Cervicalgia: Secondary | ICD-10-CM | POA: Diagnosis not present

## 2023-03-06 DIAGNOSIS — K219 Gastro-esophageal reflux disease without esophagitis: Secondary | ICD-10-CM | POA: Diagnosis not present

## 2023-03-06 DIAGNOSIS — G43909 Migraine, unspecified, not intractable, without status migrainosus: Secondary | ICD-10-CM | POA: Diagnosis not present

## 2023-03-06 DIAGNOSIS — M810 Age-related osteoporosis without current pathological fracture: Secondary | ICD-10-CM | POA: Diagnosis not present

## 2023-03-06 DIAGNOSIS — Z789 Other specified health status: Secondary | ICD-10-CM | POA: Diagnosis not present

## 2023-03-18 DIAGNOSIS — M542 Cervicalgia: Secondary | ICD-10-CM | POA: Diagnosis not present

## 2023-03-27 DIAGNOSIS — M542 Cervicalgia: Secondary | ICD-10-CM | POA: Diagnosis not present

## 2023-04-01 DIAGNOSIS — M542 Cervicalgia: Secondary | ICD-10-CM | POA: Diagnosis not present

## 2023-04-08 DIAGNOSIS — M542 Cervicalgia: Secondary | ICD-10-CM | POA: Diagnosis not present

## 2023-04-25 ENCOUNTER — Encounter: Payer: Self-pay | Admitting: Neurology

## 2023-04-25 ENCOUNTER — Ambulatory Visit (INDEPENDENT_AMBULATORY_CARE_PROVIDER_SITE_OTHER): Payer: PPO | Admitting: Neurology

## 2023-04-25 VITALS — BP 121/74 | HR 79 | Ht 61.0 in | Wt 104.0 lb

## 2023-04-25 DIAGNOSIS — G43009 Migraine without aura, not intractable, without status migrainosus: Secondary | ICD-10-CM | POA: Diagnosis not present

## 2023-04-25 MED ORDER — RIZATRIPTAN BENZOATE 10 MG PO TBDP: 10.0000 mg | ORAL_TABLET | ORAL | 11 refills | Status: DC | PRN

## 2023-04-25 MED ORDER — TOPIRAMATE 25 MG PO TABS: 25.0000 mg | ORAL_TABLET | Freq: Every evening | ORAL | 6 refills | Status: DC

## 2023-04-25 NOTE — Patient Instructions (Addendum)
Take Topiramate every night for migraine prevention If you have a migraine: Take Rizatriptan at onset and then can take it one more time in 2 hours if needed  Rizatriptan Disintegrating Tablets ?y l thu?c g? RIZATRIPTAN (??c l rye za TRIP tan) ?i?u tr? ?au n?a ??u. Thu?c ny tc d?ng b?ng cch ng?n ch?n cc tn hi?u ?au v thu h?p cc m?ch mu trong no. Thu?c ny thu?c v? m?t nhm thu?c ???c g?i l cc thu?c nhm triptan. N khng dng ?? phng ng?a ch?ng ?au n?a ??u. Thu?c ny c th? ???c dng cho nh?ng m?c ?ch khc; hy h?i ng??i cung c?p d?ch v? y t? ho?c d??c s? c?a mnh, n?u qu v? c th?c m?c. (CC) NHN HI?U PH? BI?N: Maxalt-MLT Ti c?n ph?i bo cho ng??i cung c?p d?ch v? y t? c?a mnh ?i?u g tr??c khi dng thu?c ny? H? c?n bi?t li?u qu v? hi?n c b?t k? tnh tr?ng no sau ?y hay khng: Cc v?n ?? v? tu?n hon ? ngn tay v ngn chn B?nh ti?u ???ng B?nh tim Huy?t a?p cao M?c cholesterol cao Ti?n s? tim ??p khng ??u Ti?n s? ??t qu? Cc v?n ?? v? bao t? ho?c ru?t S? d?ng thu?c l Pha?n ??ng b?t th???ng ho??c di? ??ng v??i rizatriptan, ca?c d??c ph?m kha?c, th?c ph?m, thu?c nhu?m, ho??c ch?t ba?o qua?n ?ang c thai ho??c ??nh co? thai ?ang cho con bu? Ti nn s? d?ng thu?c ny nh? th? no? U?ng thu?c ny. Dng theo ch? d?n trn nhn thu?c. Qu v? khng c?n n??c ?? u?ng thu?c ny. Hy ?? vin thu?c trong bao nim kn cho ??n khi qu v? s?n sng dng n. M? gi thu?c v?i bn tay kh v nh? nhng l?y vin thu?c ra. N?u vin thu?c b? v? ho?c v? v?n, hy v?t b?. Dng m?t vin thu?c m?i. ??t vin thu?c trn l??i v ?? n tan ra. Sau ? nu?t. Khng ???c c?t, nghi?n nt, ho?c nhai thu?c. Khng ???c dng thu?c nhi?u l?n h?n so v?i ch? d?n. Hy bn v?i ??i ng? ch?m [place] c?a qu v? v? vi?c dng thu?c ny ? tr? em. M?c d thu?c c th? ???c k toa cho tr? nh? ch? m?i 6 tu?i trong nh?ng tr??ng h?p ch?n l?c, nh?ng c?n ph?i th?n tr?ng. Qu li?u: N?u qu v? cho r?ng mnh ? dng qu  nhi?u thu?c ny, th hy lin l?c v?i trung tm ki?m sot ch?t ??c ho?c phng c?p c?u ngay l?p t?c.<br />L?U : Thu?c ny ch? dnh ring cho qu v?. Khng chia s? thu?c ny v?i nh?ng ng??i khc. N?u ti l? qun m?t li?u th sao? ?i?u ny khng p d?ng. Thu?c ny khng ph?i ?? dng th??ng l?. Nh?ng g c th? t??ng tc v?i thu?c ny? Khng ???c dng thu?c ny cng v?i b?t k? th? no sau ?y: Cc thu?c nhm ergot alkaloid, ch?ng h?n nh? dihydroergotamine, ergotamine cc thu?c ?c ch? men monoamine oxidase (monoamine oxidase inhibitor - MAOI), ch?ng h?n nh? [place], [place], [place]ate M?t s? lo?i thu?c khc dng ?? ?i?u tr? ?au n?a ??u, ch?ng h?n nh? almotriptan, eletriptan, frovatriptan, naratriptan, sumatriptan, zolmitriptan Thu?c ny c?ng c th? t??ng tc v?i cc thu?c sau ?y: M?t s? thu?c dng cho ch?ng tr?m c?m, ch?ng lo u, ho?c cc tnh tr?ng s?c kh?e tm th?n khc Propranolol Danh sch ny c th? khng m t? ?? h?t cc t??ng tc c th? x?y ra. Hy ??a cho ng??i cung c?p d?ch v? y t?  c?a mnh danh sch t?t c? cc thu?c, th?o d??c, cc thu?c khng c?n toa, ho?c cc ch? ph?m b? sung m qu v? dng. C?ng nn bo cho h? bi?t r?ng qu v? c ht thu?c, u?ng r??u, ho?c c s? d?ng ma ty tri php hay khng. Vi th? c th? t??ng tc v?i thu?c c?a qu v?. Ti c?n ph?i theo di ?i?u g trong khi dng thu?c ny? Hy ?i g?p ??i ng? ch?m [place] ?? ki?m tra ??nh k? s? ti?n tri?n c?a qu v?. Hy bo cho ??i ng? ch?m [place] c?a mnh, n?u cc tri?u ch?ng c?a qu v? khng kh h?n, ho?c tr? nn n?ng h?n. Thu?c ny c th? ?nh h??ng ??n kh? n?ng ph?i h?p, th?i gian ph?n ?ng, ho?c kh? n?ng phn ?on c?a qu v?. Khng ???c li xe ho?c v?n hnh my mc cho t?i khi bi?t ???c thu?c ny ?nh h??ng ln qu v? nh? th? no. Ng?i d?y ho?c ??ng d?y t? t? ?? gi?m nguy c? chong vng ho?c ng?t x?u. N?u qu v? dng cc thu?c tr? ?au n?a ??u trong 10 ngy ho?c nhi?u h?n m?i thng, th ch?ng ?au n?a ??u c?a qu v? c th? tr? nn n?ng h?n. Hy  l?p s? nh?t k theo di nh?ng ngy b? ?au ??u v vi?c dng thu?c. Hy lin l?c v?i ??i ng? ch?m [place] c?a mnh, n?u cc c?n ?au n?a ??u x?y ra th??ng xuyn h?n. Ti c th? nh?n th?y nh?ng tc d?ng ph? no khi dng thu?c ny? Nh?ng tc d?ng ph? qu v? c?n ph?i bo cho ??i ng? ch?m [place] cng s?m cng t?t: Cc ph?n ?ng d? ?ng -- da b? n?i ban, ng?a, n?i my ?ay, s?ng ? m?t, mi, l??i, ho?c h?ng Nng rt, ?au, c?m gic nh? b? ki?n b ho?c ??i mu s?c ? bn tay, cnh tay, chn ho?c bn chn Nh?i mu c? tim -- ?au ho?c t?c ? ng?c, vai, cnh tay ho?c hm, bu?n i, kh th?, da l?nh ho?c ?m ??t, c?m th?y y?u ?t ho?c chong vng Cc thay ??i v? nh?p tim -- nh?p tim nhanh ho?c khng ??u, chng m?t, c?m th?y y?u ?t ho?c chong vng, ?au ng?c, kh th? T?ng huy?t p B?t r?t, l l?n, nh?p tim nhanh ho?c khng ??u, c? b?p b? c?ng, rung gi?t c? b?p, v m? hi, s?t cao, co gi?t, ?n l?nh, i m?a, tiu ch?y, c th? l cc d?u hi?u c?a h?i ch?ng serotonin H?i ch?ng [place] -- ngn tay ho?c ngn chn l?nh, t ho?c ?au, c th? ??i mu t? nh?t nh?t, sang xanh d??ng, sang ?? Co gi?t ??t qu? -- ??t ng?t b? t ho?c y?u ? m?t, cnh tay ho?c chn, ni kh, l l?n, kh ?i l?i, m?t th?ng b?ng ho?c ph?i h?p ??ng tc, chng m?t, ?au ??u d? d?i, thay ??i th? l?c ?au b?ng ??t ng?t ho?c d? d?i, tiu ch?y ra mu, s?t, bu?n i, i m?a M?t th? l?c Cc tc d?ng ph? th??ng khng c?n ph?i ch?m [place] y t? (hy bo cho ??i ng? ch?m [place] n?u cc tc d?ng ph? ny ti?p di?n ho?c gy phi?n toi): Chng m?t Y?u ?t ho?c m?t m?i khc th??ng Danh sch ny c th? khng m t? ?? h?t cc tc d?ng ph? c th? x?y ra. Xin g?i t?i bc s? c?a mnh ?? ???c c? v?n chuyn mn v? cc tc d?ng ph?Ladell Heads v? c th? t??ng trnh cc tc d?ng ph? cho FDA theo s? [phone].  Ti nn c?t gi? thu?c c?a mnh ? ?u? ?? ngoi t?m v?i c?a tr? em v th v?t nui. C?t gi? ? nhi?t ?? phng t? 15 ??n 30 ?? C (58 ??n 85 ?? F). Trnh nh sng v h?i ?m. V?t b? m?i thu?c ch?a dng  sau ngy h?t h?n. ?? v?t b? cc thu?c khng cn c?n thi?t ho?c ? h?t h?n: Mang thu?c ??n ch??ng trnh nh?n l?i thu?c. Hy h?i nh thu?c c?a qu v? ho?c c? quan th?c thi php lu?t ?? tm ??a ?i?m. N?u qu v? khng th? tr? l?i thu?c, hy ki?m tra nhn ho?c t? thng tin h??ng d?n ?? xem nn v?t thu?c vo thng rc hay x? xu?ng c?u tiu. N?u qu v? khng ch?c ch?n, hy h?i ??i ng? ch?m [place]. N?u an ton ?? b? vo thng rc, hy trt h?t thu?c ra kh?i h?p ??ng. Tr?n thu?c v?i ch?t th?i c?a mo, b?i b?n, b c ph ho?c cc ch?t th?i khc. Nim kn h?n h?p trong ti ho?c h?p ??ng. B? vo thng rc. <b>L?U : ?y l b?n tm t?t. N c th? khng bao hm t?t c? thng tin c th? c. N?u qu v? th?c m?c v? thu?c ny, xin trao ??i v?i bc s?, d??c s?, ho?c ng??i cung c?p d?ch v? y t? c?a mnh.</b>  2024 Elsevier/Gold Standard (2022-05-30 00:00:00)   Topiramate Tablets ?y l thu?c g? TOPIRAMATE (??c l toe PYRE a mate) phng ng?a v ki?m sot cc c?n co gi?t ? nh?ng ng??i b? ??ng kinh. Thu?c ny c?ng c th? ???c dng ?? phng ng?a ch?ng ?au n?a ??u. N tc d?ng b?ng cch lm d?u cc dy th?n kinh ho?t ??ng qu m?c trong c? th? qu v?. Thu?c ny c th? ???c dng cho nh?ng m?c ?ch khc; hy h?i ng??i cung c?p d?ch v? y t? ho?c d??c s? c?a mnh, n?u qu v? c th?c m?c. (CC) NHN HI?U PH? BI?N: Topamax, Topiragen Ti c?n ph?i bo cho ng??i cung c?p d?ch v? y t? c?a mnh ?i?u g tr??c khi dng thu?c ny? H? c?n bi?t li?u qu v? hi?n c b?t k? tnh tr?ng no sau ?y hay khng: R?i lo?n ch?y mu B?nh th?n B?nh ph?i Qu v? ho?c thnh vin gia ?nh c  ngh?, c k? ho?ch ho?c toan tnh t? v?n Pha?n ??ng b?t th???ng ho??c di? ??ng v??i topiramate, ca?c d??c ph?m kha?c, th?c ph?m, thu?c nhu?m, ho??c ch?t ba?o qua?n ?ang c thai ho??c ??nh co? thai ?ang cho con bu? Ti nn s? d?ng thu?c ny nh? th? no? U?ng thu?c ny cng v?i n??c. Dng theo ch? d?n trn nhn thu?c, vo cng m?t th?i ?i?m m?i ngy. Khng  ???c c?t, nghi?n nt, ho?c nhai thu?c ny. Nu?t nguyn vin thu?c. Qu v? c th? u?ng thu?c ny cng ho?c khng cng v?i th?c ?n. N?u thu?c ny gy kh ch?u bao t?, th hy u?ng thu?c cng v?i th?c ?n. Duy tr dng thu?c, tr? khi ??i ng? ch?m [place] c?a qu v? yu c?u ng?ng dng thu?c. D??c s? s? ??a cho qu v? m?t B?n H??ng D?n v? D??c Ph?m (MedGuide) ??c bi?t cho m?i toa thu?c v cho m?i l?n mua thm thu?c ?. Hy b?o ??m ??c k? thng tin ny m?i l?n. Hy bn v?i ??i ng? ch?m [place] c?a qu v? v? vi?c dng thu?c ny ? tr? em. M?c d thu?c c th? ???c k toa cho tr? nh? ch? m?i 2 tu?i trong nh?ng tr??ng h?p ch?n  l?c, nh?ng c?n ph?i th?n tr?ng. Qu li?u: N?u qu v? cho r?ng mnh ? dng qu nhi?u thu?c ny, th hy lin l?c v?i trung tm ki?m sot ch?t ??c ho?c phng c?p c?u ngay l?p t?c.<br />L?U : Thu?c ny ch? dnh ring cho qu v?. Khng chia s? thu?c ny v?i nh?ng ng??i khc. N?u ti l? qun m?t li?u th sao? N?u qu v? l? qun m?t li?u thu?c, th hy u?ng li?u thu?c ? cng s?m cng t?t, tr? khi ch? cn cch li?u thu?c k? ti?p d??i 6 gi? ??ng h?. N?u cn cch li?u thu?c k? ti?p d??i 6 gi? ??ng h?, th hy b? qua li?u thu?c ? b? l? qun. Hy dng li?u thu?c k? ti?p vo th?i gian th??ng l?Imagene Sheller ???c dng li?u g?p ?i ho?c dng thm li?u. Nh?ng g c th? t??ng tc v?i thu?c ny? Acetazolamide R??u Cc thu?c khng histamine tr? d? ?ng, ho v c?m l?nh Aspirin v cc thu?c gi?ng aspirin Atropine M?t s? thu?c dng cho ch?ng lo u ho?c thu?c ng? M?t s? thu?c dng ?? tr? cc v?n ?? v? b?ng ?i, ch?ng h?n nh? oxybutynin, tolterodine M?t s? thu?c dng cho ch?ng tr?m c?m, ch?ng h?n nh? amitriptyline, fluoxetine, sertraline M?t s? thu?c dng ?? tr? b?nh Parkinson, ch?ng h?n nh? benztropine, trihexyphenidyl M?t s? thu?c dng cho cc ch?ng co gi?t, ch?ng h?n nh? carbamazepine, lamotrigine, phenobarbital, phenytoin, primidone, acid valproic, zonisamide M?t s? thu?c dng cho cc v?n ?? v? bao t?, ch?ng h?n nh?  dicyclomine, hyoscyamine M?t s? thu?c dng cho ch?ng say tu xe, ch?ng h?n nh? scopolamine M?t s? thu?c ?i?u tr? ho?c phng ng?a c?c mu ?ng, ch?ng h?n nh? warfarin, enoxaparin, dalteparin, apixaban, dabigatran, rivaroxaban Digoxin Diltiazem Cc n?i ti?t t? estrogen v progestin Cc thu?c gy m ton thn, ch?ng h?n nh? halothane, isoflurane, methoxyflurane, propofol Glyburide Hydrochlorothiazide Ipratropium Lithium Cc thu?c lm gin c? Metformin Cc thu?c khng vim khng steroid (NSAID), dng ?? ?i?u tr? ?au v vim, ch?ng h?n nh? ibuprofen ho?c naproxen Cc thu?c gia?m ?au nhm opioid Cc thu?c nhm phenothiazine, ch?ng h?n nh? chlorpromazine, mesoridazine, prochlorperazine, thioridazine Pioglitazone Danh sch ny c th? khng m t? ?? h?t cc t??ng tc c th? x?y ra. Hy ??a cho ng??i cung c?p d?ch v? y t? c?a mnh danh sch t?t c? cc thu?c, th?o d??c, cc thu?c khng c?n toa, ho?c cc ch? ph?m b? sung m qu v? dng. C?ng nn bo cho h? bi?t r?ng qu v? c ht thu?c, u?ng r??u, ho?c c s? d?ng ma ty tri php hay khng. Vi th? c th? t??ng tc v?i thu?c c?a qu v?. Ti c?n ph?i theo di ?i?u g trong khi dng thu?c ny? Hy ?i g?p ??i ng? ch?m [place] ?? ki?m tra ??nh k? s? ti?n tri?n c?a qu v?. Hy bo cho ??i ng? ch?m [place] c?a mnh, n?u cc tri?u ch?ng c?a qu v? khng kh h?n, ho?c tr? nn n?ng h?n. Khng ???c ??t ng?t ng?ng dng thu?c ny. Qu v? c th? b? ph?n ?ng nghim tr?ng. ??i ng? ch?m [place] c?a qu v? s? cho qu v? bi?t l??ng thu?c c?n dng. N?u ??i ng? ch?m [place] c?a qu v? mu?n qu v? ng?ng dng thu?c ny, th li?u dng c th? ???c gi?m t? t? ?? trnh m?i tc d?ng ph?. Hy ?eo vng tay ho?c dy chuy?n c th? ghi ch v? thng tin y t? c nhn (medical ID). Mang theo m?t t?m th? m t? v? tnh tr?ng c?a qu  v?. Li?t k cc thu?c v li?u dng c?a qu v? trn th? ?. Thu?c ny c th? ?nh h??ng ??n kh? n?ng ph?i h?p, th?i gian ph?n ?ng, ho?c kh? n?ng phn ?on c?a qu v?. Khng ???c  li xe ho?c v?n hnh my mc cho t?i khi bi?t ???c thu?c ny ?nh h??ng ln qu v? nh? th? no. Ng?i d?y ho?c ??ng d?y t? t? ?? gi?m nguy c? chong vng ho?c ng?t x?u. U?ng r??u km v?i thu?c ny c th? lm t?ng nguy c? b? cc tc d?ng ph? ny. Thu?c ny c th? gy ra nh?ng ph?n ?ng nghim tr?ng trn da. Nh?ng ph?n ?ng ny c th? x?y ra t? vi tu?n ??n vi thng sau khi b?t ??u dng thu?c. Hy lin l?c ngay v?i ??i ng? ch?m [place], n?u qu v? b? s?t ho?c c tri?u ch?ng gi?ng nh? cm km theo n?i ban. Ban c th? c mu ?? ho?c tm v sau ? chuy?n thnh ph?ng r?p ho?c bong trc da. Qu v? c?ng c th? th?y n?i ban ?? km theo s?ng ? m?t, mi ho?c h?ch b?ch huy?t ? c? ho?c d??i cnh tay. Thu?c ny c th? lm xu?t hi?n  ngh? t? v?n ho?c tr?m c?m. ?i?u ny bao g?m nh?ng thay ??i ??t ng?t v? tm tr?ng, hnh vi ho?c suy ngh?Marland Kitchen Nh?ng thay ??i ny c th? x?y ra b?t c? lc no, nh?ng th??ng g?p h?n khi b?t ??u ?i?u tr? ho?c sau khi thay ??i li?u l??ng. Hy g?i cho ??i ng? ch?m [place] c?a mnh ngay l?p t?c n?u qu v? c nh?ng suy ngh? ny ho?c ch?ng tr?m c?m tr? n?ng h?n. N?u dng trong th?i gian di v?i li?u l??ng cao, thu?c ny c th? lm ch?m s? pht tri?n c?a con qu v?. ??i ng? ch?m [place] c?a con qu v? s? theo di s? pht tri?n c?a con qu v?. Dng thu?c ny trong m?t th?i gian di c th? lm x??ng c?a qu v? b? y?u ?i. Nguy c? gy x??ng c th? t?ng ln. Hy bn b?c v?i ??i ng? ch?m [place] v? s?c kh?e x??ng c?a qu v?. Bn b?c v? thu?c ny v?i ??i ng? ch?m [place] n?u qu v? c th? ?ang mang [place]. Thu?c ny c th? gy d? t?t b?m sinh nghim tr?ng n?u dng trong [place] k?. C nh?ng l?i ch v nguy c? khi dng thu?c trong [place] k?. ??i ng? ch?m [place] c th? gip qu v? tm ra l?a ch?n ph h?p v?i qu v?. Nn s? d?ng bi?n php ng?a thai trong th?i gian dng thu?c ny. Cc n?i ti?t t? estrogen v progestin c th? khng c tc d?ng t?t trong th?i gian qu v? dng thu?c ny. ??i ng? ch?m [place] c th? gip qu v? tm ra l?a ch?n ph h?p v?i qu  v?. Hy h?i  ki?n ??i ng? ch?m [place] tr??c khi nui con b?ng s?a m?. C th? c?n thay ??i k? ho?ch ?i?u tr? c?a qu v?. Ti c th? nh?n th?y nh?ng tc d?ng ph? no khi dng thu?c ny? Nh?ng tc d?ng ph? qu v? c?n ph?i bo cho ??i ng? ch?m  cng s?m cng t?t: Cc ph?n ?ng d? ?ng -- da b? n?i ban, ng?a, n?i my ?ay, s?ng ? m?t, mi, l??i, ho?c h?ng M?c acid cao -- kh th?, y?u ?t ho?c m?t m?i khc th??ng, l l?n, ?au ??u, nh?p tim nhanh ho?c khng ??u, bu?n i, i m?a M?c ammonia cao -- y?u ?t ho?c m?t  m?i khc th??ng, l l?n, m?t c?m gic ngon mi?ng, bu?n i, i m?a, co gi?t S?t khng h?t, gi?m ti?t m? hi S?i th?n -- c mu trong n??c ti?u; ?au khi ?i ti?u ho?c kh ?i ti?u; ?au vng l?ng d??i ho?c hai bn s??n M?n ??, r?p da, bong ho?c trc da bao g?m bn trong mi?ng ??t ng?t b? ?au m?t ho?c thay ??i th? l?c nh? m? m?t, nhn th?y qu?ng sng xung quanh ?n, gi?m th? l?c  ngh? v? t? v?n ho?c t? lm h?i b?n thn, tm tr?ng x?u ?i, c?m gic tr?m c?m Cc tc d?ng ph? th??ng khng c?n ph?i ch?m [place] y t? (hy bo cho ??i ng? ch?m [place] n?u cc tc d?ng ph? ny ti?p di?n ho?c gy phi?n toi): Nng rt ho?c c?m gic nh? b? ki?n b ? bn tay ho?c bn chn Kh kh?n v? t?p trung ch , tr nh? ho?c l?i ni Chng m?t Bu?n ng? M?t m?i M?t c?m gic ngon mi?ng km theo s?t k Chuy?n ??ng c th? ch?m ch?p ho?c l? ?? Danh sch ny c th? khng m t? ?? h?t cc tc d?ng ph? c th? x?y ra. Xin g?i t?i bc s? c?a mnh ?? ???c c? v?n chuyn mn v? cc tc d?ng ph?Ladell Heads v? c th? t??ng trnh cc tc d?ng ph? cho FDA theo s? [phone]. Ti nn c?t gi? thu?c c?a mnh ? ?u? ?? ngoi t?m v?i c?a tr? em v th v?t nui. C?t gi? ? nhi?t ?? t? 14 ??n 30 ?? C (58 ??n 85 ?? F). Trnh ?m ??t. ??y ch?t h?p ??ng thu?c. V?t b? m?i thu?c ch?a dng sau ngy h?t h?n. ?? v?t b? cc thu?c khng cn c?n thi?t ho?c ? h?t h?n: Mang thu?c ??n ch??ng trnh nh?n l?i thu?c. Hy h?i nh thu?c c?a qu v? ho?c c? quan th?c thi php  lu?t ?? tm ??a ?i?m. N?u qu v? khng th? tr? l?i thu?c, hy ki?m tra nhn ho?c t? thng tin h??ng d?n ?? xem nn v?t thu?c vo thng rc hay x? xu?ng c?u tiu. N?u qu v? khng ch?c ch?n, hy h?i ??i ng? ch?m [place]. N?u an ton ?? b? vo thng rc, hy trt h?t thu?c ra kh?i h?p ??ng. Tr?n thu?c v?i ch?t th?i c?a mo, b?i b?n, b c ph ho?c cc ch?t th?i khc. Nim kn h?n h?p trong ti ho?c h?p ??ng. B? vo thng rc. <b>L?U : ?y l b?n tm t?t. N c th? khng bao hm t?t c? thng tin c th? c. N?u qu v? th?c m?c v? thu?c ny, xin trao ??i v?i bc s?, d??c s?, ho?c ng??i cung c?p d?ch v? y t? c?a mnh.</b>  2024 Elsevier/Gold Standard (2022-05-30 00:00:00)

## 2023-04-25 NOTE — Progress Notes (Signed)
[facility]    Provider:  Dr Lucia Gaskins Requesting Provider: Carilyn Goodpasture, NP Primary Care Provider:  Patient, No Pcp Per  CC:  migraines  HPI:  Judith Sherman is a 67 y.o. female here as requested by Carilyn Goodpasture, NP for migraines. has Establishing care with new doctor, encounter for; Annual physical exam; Loss of weight; Gastroesophageal reflux disease; Dyslipidemia; Degenerative disc disease, lumbar; Hyperlipidemia; Migraine without aura and responsive to treatment; Other allergic rhinitis; Language barrier; Vaginal dryness; Tooth decay; Hypovitaminosis D; Coughing; and Migraine without aura and without status migrainosus, not intractable on their problem list.  I reviewed referral notes she also has a past medical history of sleep disturbance, dyslipidemia, chronic superficial gastritis, migraine without status migrainosus not intractable, GERD, sleep disturbance.  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  Per Lurena Nida in the emergency room with patient was seen, she has a history of hypertension, hyperlipidemia migraine she presented with headaches same quality as prior ibuprofen helped a little on that day she had nausea and few episodes of vomiting, she has well-documented history of migraines and she has had an MRI and tWO CT scans previously that have been normal, no concerning features on examination.  Patient is here with interpreter  she is [place] speaking only, she has migraines ongoing for years, pulsating pounding throbbing, no medication overuse, no aura, it hurts to move when she has them, she can have nausea, vomiting, light sensitivity.  They can be moderate to severe and she has been seen in the emergency room for her migraines.  There is lots of stress at home she is worried about her husband's illness. Interpreter Loistine Chance. She has had the migraines for 20 years. Migraines worening in the setting of stress in the last 6 months Migraines worsen when talking on the phone.Also stress and nager makes the migraines worse. When she takes medicine, or rubs her head/massage she feels better and the migraines improve. They can be moderate to severe and can last 4-24 hours. Sumatriptan helps but she has not gone to doctor. She has about 12 migraine days a month. Amitriptyline is helping her sleep and helping her migraines but upsets her stomach so she does not take it all the time at night due to side effects.  From a thorough review of records and patient report, medications tried that can be used in migraine/headache management includes Compazine, amitriptyline, Tylenol and other over-the-counter analgesics such as ibuprofen, tizanidine, prednisone, magnesium, meloxicam, nortriptyline, Zofran, sumatriptan. Blood pressure medications contraindicated due to hypotension.   CT of the head 12/24/2015; CLINICAL DATA:  Acute intermittent headaches with dizziness   EXAM: CT HEAD WITHOUT CONTRAST   TECHNIQUE: Contiguous axial images were obtained from the base of the skull through the vertex without intravenous contrast.   COMPARISON:  None.   FINDINGS: Brain: No acute intracranial hemorrhage, mass lesion, infarction, midline shift, herniation, hydrocephalus, or extra-axial fluid collection. No focal mass effect or  edema. Normal gray-white matter differentiation. Symmetric dense bilateral basal ganglia calcifications. Cisterns are  patent. No cerebellar abnormality.   Vascular: No hyperdense vessel or unexpected calcification.   Skull: Negative for fracture or focal lesion.   Sinuses/Orbits: No acute findings.   IMPRESSION: 9reviewed images and agree) No acute intracranial abnormality.  Reviewed bloodwork      Latest Ref Rng & Units 12/29/2022   12:01 PM 12/19/2017    9:41 AM 04/30/2017   11:44 AM  CBC  WBC 4.0 - 10.5 K/uL 7.3  6.4  5.6   Hemoglobin 12.0 - 15.0 g/dL 84.6  96.2  95.2   Hematocrit 36.0 - 46.0 % 39.3  39.9  39.2   Platelets 150 - 400 K/uL 279  291  357       Latest Ref Rng & Units 12/29/2022   12:01 PM 12/19/2017    9:41 AM 04/30/2017   11:44 AM  CMP  Glucose 70 - 99 mg/dL 841  78  324   BUN 8 - 23 mg/dL 13  13  11    Creatinine 0.44 - 1.00 mg/dL 4.01  0.27  2.53   Sodium 135 - 145 mmol/L 138  143  143   Potassium 3.5 - 5.1 mmol/L 3.7  3.9  3.6   Chloride 98 - 111 mmol/L 104  104  106   CO2 22 - 32 mmol/L 25  25  21    Calcium 8.9 - 10.3 mg/dL 9.0  9.3  9.1   Total Protein 6.0 - 8.5 g/dL  7.4  7.3   Total Bilirubin 0.0 - 1.2 mg/dL  0.8  0.4   Alkaline Phos 39 - 117 IU/L  69  76   AST 0 - 40 IU/L  20  23   ALT 0 - 32 IU/L  16  17      Review of Systems: Patient complains of symptoms per HPI as well as the following symptoms none. Pertinent negatives and positives per HPI. All others negative.   Social History   Socioeconomic History   Marital status: Married    Spouse name: Not on file   Number of children: 0   Years of education: Not on file   Highest education level: Not on file  Occupational History   Occupation: unemployed  Tobacco Use   Smoking status: Never   Smokeless tobacco: Never  Vaping Use   Vaping status: Never Used  Substance and Sexual Activity   Alcohol use: No    Alcohol/week: 0.0 standard drinks of alcohol   Drug use: No   Sexual activity: Not on file  Other Topics Concern   Not on file  Social History Narrative   Not on file   Social Determinants  of Health   Financial Resource Strain: Not on file  Food Insecurity: Not on file  Transportation Needs: Not on file  Physical Activity: Not on file  Stress: Not on file  Social Connections: Not on file  Intimate Partner Violence: Not on file    Family History  Problem Relation Age of Onset   Hypertension Mother    Headache Sister    Colon cancer Neg Hx    Asthma Neg Hx    Allergic rhinitis Neg Hx     Past Medical History:  Diagnosis Date   Allergic rhinitis    Hyperlipidemia    Hypertension    LPRD (laryngopharyngeal reflux disease)    Evaluated by [facility]    Patient Active Problem List  Diagnosis Date Noted   Migraine without aura and without status migrainosus, not intractable 04/25/2023   Coughing 09/04/2018   Hypovitaminosis D 10/03/2016   Tooth decay 06/07/2016   Language barrier 02/24/2016   Vaginal dryness 02/24/2016   Migraine without aura and responsive to treatment 12/14/2015   Other allergic rhinitis 12/14/2015   Degenerative disc disease, lumbar 05/20/2015   Hyperlipidemia 05/20/2015   Loss of weight 05/06/2014   Gastroesophageal reflux disease 05/06/2014   Dyslipidemia 05/06/2014   Annual physical exam 01/18/2014   Establishing care with new doctor, encounter for 01/12/2014    Past Surgical History:  Procedure Laterality Date   BREAST LUMPECTOMY     benign   COLONOSCOPY N/A 07/12/2014   SLF:  1. small ulcer in teh terminal ileum 2. Small internal hemorrhoids.   TONSILLECTOMY      Current Outpatient Medications  Medication Sig Dispense Refill   acetaminophen (TYLENOL) 500 MG tablet Take 2 tablets (1,000 mg total) by mouth every 8 (eight) hours as needed. Take 2 tabs every 8 hours. 30 tablet 0   azelastine (ASTELIN) 0.1 % nasal spray Use 1-2 sprays twice a day as needed for nasal drainage. 30 mL 5   cetirizine (ZYRTEC) 10 MG tablet Take 1 tablet (10 mg total) by mouth daily. One tab daily for allergies 30 tablet 1    famotidine (PEPCID) 20 MG tablet TAKE 1 TABLET BY MOUTH EVERYDAY AT BEDTIME 30 tablet 0   fluticasone (FLONASE) 50 MCG/ACT nasal spray Place 1 spray into both nostrils 2 (two) times daily. For nasal congestion. 16 g 5   montelukast (SINGULAIR) 10 MG tablet Take 1 tablet (10 mg total) by mouth at bedtime. For allergies. 30 tablet 5   OVER THE COUNTER MEDICATION      pantoprazole (PROTONIX) 40 MG tablet Take 1 tablet (40 mg total) by mouth 2 (two) times daily. For reflux - take 30 minutes before meals. 60 tablet 3   prochlorperazine (COMPAZINE) 10 MG tablet Take 1 tablet (10 mg total) by mouth 2 (two) times daily as needed (for headache). 10 tablet 0   rizatriptan (MAXALT-MLT) 10 MG disintegrating tablet Take 1 tablet (10 mg total) by mouth as needed for migraine. May repeat in 2 hours if needed 9 tablet 11   simvastatin (ZOCOR) 40 MG tablet Take 1 tablet (40 mg total) by mouth at bedtime. 90 tablet 3   topiramate (TOPAMAX) 25 MG tablet Take 1 tablet (25 mg total) by mouth at bedtime. 30 tablet 6   No current facility-administered medications for this visit.    Allergies as of 04/25/2023 - Review Complete 04/25/2023  Allergen Reaction Noted   Aspirin  05/06/2015    Vitals: BP 121/74   Pulse 79   Ht 5\' 1"  (1.549 m)   Wt 104 lb (47.2 kg)   BMI 19.65 kg/m  Last Weight:  Wt Readings from Last 1 Encounters:  04/25/23 104 lb (47.2 kg)   Last Height:   Ht Readings from Last 1 Encounters:  04/25/23 5\' 1"  (1.549 m)     Physical exam: Exam: Gen: NAD, conversant, well nourised, well groomed                     CV: RRR, no MRG. No Carotid Bruits. No peripheral edema, warm, nontender Eyes: Conjunctivae clear without exudates or hemorrhage  Neuro: Detailed Neurologic Exam  Speech:    Speech is normal; fluent and spontaneous with normal comprehension.  Cognition:    The patient  is oriented to person, place, and time;     recent and remote memory intact;     language fluent;      normal attention, concentration,     fund of knowledge Cranial Nerves:    The pupils are equal, round, and reactive to light. The fundi are normal and spontaneous venous pulsations are present. Visual fields are full to finger confrontation. Extraocular movements are intact. Trigeminal sensation is intact and the muscles of mastication are normal. The face is symmetric. The palate elevates in the midline. Hearing intact. Voice is normal. Shoulder shrug is normal. The tongue has normal motion without fasciculations.   Coordination:    Normal finger to nose and heel to shin. Normal rapid alternating movements.   Gait:    Heel-toe and tandem gait are normal.   Motor Observation:    No asymmetry, no atrophy, and no involuntary movements noted. Tone:    Normal muscle tone.    Posture:    Posture is normal. normal erect    Strength:    Strength is V/V in the upper and lower limbs.      Sensation: intact to LT     Reflex Exam:  DTR's:    Deep tendon reflexes in the upper and lower extremities are normal bilaterally.   Toes:    The toes are downgoing bilaterally.   Clonus:    Clonus is absent.    Assessment/Plan:  67 year old with many years of migraines without aura not intractable. She states amitriptyline helping but not taking it every night due to side effects. We discussed taking acute migraine management and/or preventative. Taking the amitriptyline 3 nights a week.   Try Rizatriptan acutely Ondansetron for nausea Physical Therapy Physical therapy for neck pain, tight cervical muscles causing cervicalgia and worsening migraines: She declines states she has already been going. Take Topiramate every night for migraine prevention If you have a migraine: Take Rizatriptan at onset and then can take it one more time in 2 hours if needed   Orders Placed This Encounter  Procedures   TSH Rfx on Abnormal to Free T4   Meds ordered this encounter  Medications   topiramate  (TOPAMAX) 25 MG tablet    Sig: Take 1 tablet (25 mg total) by mouth at bedtime.    Dispense:  30 tablet    Refill:  6   rizatriptan (MAXALT-MLT) 10 MG disintegrating tablet    Sig: Take 1 tablet (10 mg total) by mouth as needed for migraine. May repeat in 2 hours if needed    Dispense:  9 tablet    Refill:  11    Cc: Carilyn Goodpasture, NP,  Patient, No Pcp Per  Naomie Dean, MD  [facility] [address] [place], [place] [address]  Phone [phone] Fax [phone]

## 2023-04-28 ENCOUNTER — Encounter: Payer: Self-pay | Admitting: Neurology

## 2023-05-07 DIAGNOSIS — K219 Gastro-esophageal reflux disease without esophagitis: Secondary | ICD-10-CM | POA: Diagnosis not present

## 2023-05-07 DIAGNOSIS — J309 Allergic rhinitis, unspecified: Secondary | ICD-10-CM | POA: Diagnosis not present

## 2023-05-07 DIAGNOSIS — G43909 Migraine, unspecified, not intractable, without status migrainosus: Secondary | ICD-10-CM | POA: Diagnosis not present

## 2023-05-07 DIAGNOSIS — E785 Hyperlipidemia, unspecified: Secondary | ICD-10-CM | POA: Diagnosis not present

## 2023-05-07 DIAGNOSIS — M810 Age-related osteoporosis without current pathological fracture: Secondary | ICD-10-CM | POA: Diagnosis not present

## 2023-05-07 DIAGNOSIS — M62838 Other muscle spasm: Secondary | ICD-10-CM | POA: Diagnosis not present

## 2023-05-07 DIAGNOSIS — Z0000 Encounter for general adult medical examination without abnormal findings: Secondary | ICD-10-CM | POA: Diagnosis not present

## 2023-05-07 DIAGNOSIS — Z789 Other specified health status: Secondary | ICD-10-CM | POA: Diagnosis not present

## 2023-05-07 DIAGNOSIS — R3989 Other symptoms and signs involving the genitourinary system: Secondary | ICD-10-CM | POA: Diagnosis not present

## 2023-05-07 DIAGNOSIS — R3129 Other microscopic hematuria: Secondary | ICD-10-CM | POA: Diagnosis not present

## 2023-06-26 DIAGNOSIS — J988 Other specified respiratory disorders: Secondary | ICD-10-CM | POA: Diagnosis not present

## 2023-06-26 DIAGNOSIS — R300 Dysuria: Secondary | ICD-10-CM | POA: Diagnosis not present

## 2023-07-29 DIAGNOSIS — J0110 Acute frontal sinusitis, unspecified: Secondary | ICD-10-CM | POA: Diagnosis not present

## 2023-08-14 DIAGNOSIS — R3121 Asymptomatic microscopic hematuria: Secondary | ICD-10-CM | POA: Diagnosis not present

## 2023-09-13 DIAGNOSIS — Z789 Other specified health status: Secondary | ICD-10-CM | POA: Diagnosis not present

## 2023-09-13 DIAGNOSIS — K219 Gastro-esophageal reflux disease without esophagitis: Secondary | ICD-10-CM | POA: Diagnosis not present

## 2023-09-13 DIAGNOSIS — R682 Dry mouth, unspecified: Secondary | ICD-10-CM | POA: Diagnosis not present

## 2023-10-18 DIAGNOSIS — R42 Dizziness and giddiness: Secondary | ICD-10-CM | POA: Diagnosis not present

## 2023-10-18 DIAGNOSIS — K219 Gastro-esophageal reflux disease without esophagitis: Secondary | ICD-10-CM | POA: Diagnosis not present

## 2023-10-18 DIAGNOSIS — R6889 Other general symptoms and signs: Secondary | ICD-10-CM | POA: Diagnosis not present

## 2023-10-22 NOTE — Progress Notes (Unsigned)
 No chief complaint on file.   HISTORY OF PRESENT ILLNESS:  10/22/23 ALL:  Judith Sherman is a 68 y.o. female here today for follow up for  migraines. She was seen in consult with Dr Lucia Gaskins 04/2023 and advised to start topiramate 25mg  daily and rizatriptan as needed.    HISTORY (copied from Dr Trevor Mace previous note)  HPI:  Judith Sherman is a 68 y.o. female here as requested by Carilyn Goodpasture, NP for migraines. has Establishing care with new doctor, encounter for; Annual physical exam; Loss of weight; Gastroesophageal reflux disease; Dyslipidemia; Degenerative disc disease, lumbar; Hyperlipidemia; Migraine without aura and responsive to treatment; Other allergic rhinitis; Language barrier; Vaginal dryness; Tooth decay; Hypovitaminosis D; Coughing; and Migraine without aura and without status migrainosus, not intractable on their problem list.  I reviewed referral notes she also has a past medical history of sleep disturbance, dyslipidemia, chronic superficial gastritis, migraine without status migrainosus not intractable, GERD, sleep disturbance.  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  Per Lurena Nida in the emergency room with patient was seen, she has a history of hypertension, hyperlipidemia migraine she presented with headaches same quality as prior ibuprofen helped a little on that day she had nausea and few episodes of vomiting, she has well-documented  history of migraines and she has had an MRI and tWO CT scans previously that have been normal, no concerning features on examination.   Patient is here with interpreter she is [place] speaking only, she has migraines ongoing for years, pulsating pounding throbbing, no medication overuse, no aura, it hurts to move when she has them, she can have nausea, vomiting, light sensitivity.  They can be moderate to severe and she has been seen in the emergency room for her migraines.  There is lots of stress at home she is worried about her husband's illness. Interpreter Loistine Chance. She has had the migraines for 20 years. Migraines worening in the setting of stress in the last 6 months Migraines worsen when talking on the phone.Also stress and nager makes the migraines worse. When she takes medicine, or rubs her head/massage she feels better and the migraines improve. They can be moderate to severe and can last 4-24 hours. Sumatriptan helps but she has not gone to doctor. She has about 12 migraine days a month. Amitriptyline is helping her sleep and helping her migraines but upsets her stomach so she does not take it all the time at night due to side effects.   From a thorough review of records and patient report, medications tried that can be used in migraine/headache management includes Compazine, amitriptyline, Tylenol and other over-the-counter analgesics such as ibuprofen, tizanidine, prednisone, magnesium, meloxicam, nortriptyline, Zofran, sumatriptan. Blood pressure medications contraindicated due to hypotension.    REVIEW OF SYSTEMS: Out of a complete 14 system review of symptoms, the patient complains only of the following symptoms, and all other reviewed systems are negative.   ALLERGIES:  Allergies  Allergen Reactions   Aspirin      HOME MEDICATIONS: Outpatient Medications Prior to Visit  Medication Sig Dispense Refill   acetaminophen (TYLENOL) 500 MG tablet Take 2 tablets (1,000 mg total) by  mouth every 8 (eight) hours as needed. Take 2 tabs every 8 hours. 30 tablet 0   azelastine (ASTELIN) 0.1 % nasal spray Use 1-2 sprays twice a day as needed for nasal drainage. 30 mL 5   cetirizine (ZYRTEC) 10 MG tablet Take 1 tablet (10 mg total) by mouth daily. One tab daily for allergies 30 tablet 1   famotidine (PEPCID) 20 MG tablet TAKE 1 TABLET BY MOUTH EVERYDAY AT BEDTIME 30 tablet 0   fluticasone (FLONASE) 50 MCG/ACT nasal spray Place 1 spray into both nostrils 2 (two) times daily. For nasal congestion. 16 g 5   montelukast (SINGULAIR) 10 MG tablet Take 1 tablet (10 mg total) by mouth at bedtime. For allergies. 30 tablet 5   OVER THE COUNTER MEDICATION      pantoprazole (PROTONIX) 40 MG tablet Take 1 tablet (40 mg total) by mouth 2 (two) times daily. For reflux - take 30 minutes before meals. 60 tablet 3   prochlorperazine (COMPAZINE) 10 MG tablet Take 1 tablet (10 mg total) by mouth 2 (two) times daily as needed (for headache). 10 tablet 0   rizatriptan (MAXALT-MLT) 10 MG disintegrating tablet Take 1 tablet (10 mg total) by mouth as needed for migraine. May repeat in 2 hours if needed 9 tablet 11   simvastatin (ZOCOR) 40 MG tablet Take 1 tablet (40 mg total) by mouth at bedtime. 90 tablet 3   topiramate (TOPAMAX) 25 MG tablet Take 1 tablet (25 mg total) by mouth at bedtime. 30 tablet 6   No facility-administered medications prior to visit.     PAST MEDICAL HISTORY: Past Medical History:  Diagnosis Date   Allergic rhinitis    Hyperlipidemia    Hypertension    LPRD (laryngopharyngeal reflux disease)    Evaluated by [facility]     PAST SURGICAL HISTORY: Past Surgical History:  Procedure Laterality Date   BREAST LUMPECTOMY     benign   COLONOSCOPY N/A 07/12/2014   SLF:  1. small ulcer in teh terminal ileum 2. Small internal hemorrhoids.   TONSILLECTOMY       FAMILY HISTORY: Family History  Problem Relation Age of Onset   Hypertension Mother     Headache Sister    Colon cancer Neg Hx    Asthma Neg Hx    Allergic rhinitis Neg Hx      SOCIAL HISTORY: Social History   Socioeconomic History   Marital status: Married    Spouse name: Not on file   Number of children: 0   Years of education: Not on file   Highest education level: Not on file  Occupational History   Occupation: unemployed  Tobacco Use   Smoking status: Never   Smokeless tobacco: Never  Vaping Use   Vaping status: Never Used  Substance and Sexual Activity   Alcohol use: No    Alcohol/week: 0.0 standard drinks of alcohol   Drug use: No   Sexual activity: Not on file  Other Topics Concern   Not on file  Social History Narrative   Not on file   Social Drivers of Health   Financial Resource Strain: Not on file  Food Insecurity: Not on file  Transportation Needs: Not on file  Physical Activity: Not on file  Stress: Not on file  Social Connections: Not on file  Intimate Partner Violence: Not on file     PHYSICAL EXAM  There were no vitals filed for this visit. There is no height or weight on file to calculate BMI.  Generalized: Well developed, in no acute distress  Cardiology: normal rate and rhythm, no murmur auscultated  Respiratory: clear to auscultation bilaterally    Neurological examination  Mentation: Alert oriented to time, place, history taking. Follows all commands speech and language fluent Cranial nerve II-XII: Pupils were equal round reactive to light. Extraocular movements were full, visual field were full on confrontational test. Facial sensation and strength were normal. Uvula tongue midline. Head turning and shoulder shrug  were normal and symmetric. Motor: The motor testing reveals 5 over 5 strength of all 4 extremities. Good symmetric motor tone is noted throughout.  Sensory: Sensory testing is intact to soft touch on all 4 extremities. No evidence of extinction is noted.  Coordination: Cerebellar testing reveals good  finger-nose-finger and heel-to-shin bilaterally.  Gait and station: Gait is normal. Tandem gait is normal. Romberg is negative. No drift is seen.  Reflexes: Deep tendon reflexes are symmetric and normal bilaterally.    DIAGNOSTIC DATA (LABS, IMAGING, TESTING) - I reviewed patient records, labs, notes, testing and imaging myself where available.  Lab Results  Component Value Date   WBC 7.3 12/29/2022   HGB 13.1 12/29/2022   HCT 39.3 12/29/2022   MCV 91.8 12/29/2022   PLT 279 12/29/2022      Component Value Date/Time   NA 138 12/29/2022 1201   NA 143 12/19/2017 0941   K 3.7 12/29/2022 1201   CL 104 12/29/2022 1201   CO2 25 12/29/2022 1201   GLUCOSE 136 (H) 12/29/2022 1201   BUN 13 12/29/2022 1201   BUN 13 12/19/2017 0941   CREATININE 0.57 12/29/2022 1201   CREATININE 0.52 10/03/2016 1026   CALCIUM 9.0 12/29/2022 1201   PROT 7.4 12/19/2017 0941   ALBUMIN 4.4 12/19/2017 0941   AST 20 12/19/2017 0941   ALT 16 12/19/2017 0941   ALKPHOS 69 12/19/2017 0941   BILITOT 0.8 12/19/2017 0941   GFRNONAA >60 12/29/2022 1201   GFRNONAA >89 10/03/2016 1026   GFRAA 109 12/19/2017 0941   GFRAA >89 10/03/2016 1026   Lab Results  Component Value Date   CHOL 176 12/19/2017   HDL 67 12/19/2017   LDLCALC 92 12/19/2017   TRIG 85 12/19/2017   CHOLHDL 2.6 12/19/2017   Lab Results  Component Value Date   HGBA1C 5.7 (H) 12/19/2017   No results found for: "VITAMINB12" Lab Results  Component Value Date   TSH 2.090 12/19/2017        No data to display               No data to display           ASSESSMENT AND PLAN  68 y.o. year old female  has a past medical history of Allergic rhinitis, Hyperlipidemia, Hypertension, and LPRD (laryngopharyngeal reflux disease). here with    No diagnosis found.  Afsana Shelly Rubenstein ***.  Healthy lifestyle habits encouraged. *** will follow up with PCP as directed. *** will return to see me in ***, sooner if needed. *** verbalizes  understanding and agreement with this plan.   No orders of the defined types were placed in this encounter.    No orders of the defined types were placed in this encounter.    Shawan Corella,  MSN, FNP-C 10/22/2023, 4:44 PM  [facility] [address] [place], [place] [address] [phone]

## 2023-10-22 NOTE — Patient Instructions (Signed)
 Below is our plan:  We will continue topiramate 25mg  daily at bedtime and rizatriptan as needed.   Please make sure you are staying well hydrated. I recommend 50-60 ounces daily. Well balanced diet and regular exercise encouraged. Consistent sleep schedule with 6-8 hours recommended.   Please continue follow up with care team as directed.   Follow up with me in 1 year   You may receive a survey regarding today's visit. I encourage you to leave honest feed back as I do use this information to improve patient care. Thank you for seeing me today!

## 2023-10-23 ENCOUNTER — Ambulatory Visit (INDEPENDENT_AMBULATORY_CARE_PROVIDER_SITE_OTHER): Admitting: Family Medicine

## 2023-10-23 ENCOUNTER — Encounter: Payer: Self-pay | Admitting: Family Medicine

## 2023-10-23 VITALS — BP 127/74 | HR 81 | Ht 59.0 in | Wt 99.0 lb

## 2023-10-23 DIAGNOSIS — G43109 Migraine with aura, not intractable, without status migrainosus: Secondary | ICD-10-CM

## 2023-10-23 DIAGNOSIS — G43009 Migraine without aura, not intractable, without status migrainosus: Secondary | ICD-10-CM

## 2023-10-23 MED ORDER — RIZATRIPTAN BENZOATE 10 MG PO TBDP: 10.0000 mg | ORAL_TABLET | ORAL | 11 refills | Status: AC | PRN

## 2023-10-23 MED ORDER — TOPIRAMATE 25 MG PO TABS: 25.0000 mg | ORAL_TABLET | Freq: Every evening | ORAL | 3 refills | Status: AC

## 2023-11-06 DIAGNOSIS — K219 Gastro-esophageal reflux disease without esophagitis: Secondary | ICD-10-CM | POA: Diagnosis not present

## 2023-11-11 DIAGNOSIS — R9230 Dense breasts, unspecified: Secondary | ICD-10-CM | POA: Diagnosis not present

## 2023-11-11 DIAGNOSIS — J302 Other seasonal allergic rhinitis: Secondary | ICD-10-CM | POA: Diagnosis not present

## 2023-11-11 DIAGNOSIS — G43909 Migraine, unspecified, not intractable, without status migrainosus: Secondary | ICD-10-CM | POA: Diagnosis not present

## 2023-11-11 DIAGNOSIS — Z789 Other specified health status: Secondary | ICD-10-CM | POA: Diagnosis not present

## 2023-11-11 DIAGNOSIS — M810 Age-related osteoporosis without current pathological fracture: Secondary | ICD-10-CM | POA: Diagnosis not present

## 2023-11-11 DIAGNOSIS — K219 Gastro-esophageal reflux disease without esophagitis: Secondary | ICD-10-CM | POA: Diagnosis not present

## 2023-11-19 ENCOUNTER — Ambulatory Visit: Payer: PPO | Admitting: Family Medicine

## 2024-01-30 ENCOUNTER — Other Ambulatory Visit: Payer: Self-pay | Admitting: Family Medicine

## 2024-01-30 DIAGNOSIS — Z1231 Encounter for screening mammogram for malignant neoplasm of breast: Secondary | ICD-10-CM

## 2024-02-11 ENCOUNTER — Other Ambulatory Visit: Payer: Self-pay | Admitting: Family Medicine

## 2024-02-11 ENCOUNTER — Inpatient Hospital Stay
Admission: RE | Admit: 2024-02-11 | Discharge: 2024-02-11 | Discharge status: Home / Self Care | Source: Ambulatory Visit | Attending: Family Medicine | Admitting: Family Medicine

## 2024-02-11 DIAGNOSIS — Z1231 Encounter for screening mammogram for malignant neoplasm of breast: Secondary | ICD-10-CM

## 2024-02-11 DIAGNOSIS — R3121 Asymptomatic microscopic hematuria: Secondary | ICD-10-CM | POA: Diagnosis not present

## 2024-02-11 DIAGNOSIS — R2232 Localized swelling, mass and lump, left upper limb: Secondary | ICD-10-CM

## 2024-02-17 ENCOUNTER — Encounter

## 2024-02-17 ENCOUNTER — Other Ambulatory Visit

## 2024-02-21 ENCOUNTER — Ambulatory Visit
Admission: RE | Admit: 2024-02-21 | Discharge: 2024-02-21 | Disposition: A | Discharge status: Home / Self Care | Source: Ambulatory Visit | Attending: Family Medicine | Admitting: Family Medicine

## 2024-02-21 ENCOUNTER — Other Ambulatory Visit: Payer: Self-pay | Admitting: Family Medicine

## 2024-02-21 DIAGNOSIS — R928 Other abnormal and inconclusive findings on diagnostic imaging of breast: Secondary | ICD-10-CM | POA: Diagnosis not present

## 2024-02-21 DIAGNOSIS — R2232 Localized swelling, mass and lump, left upper limb: Secondary | ICD-10-CM

## 2024-02-21 DIAGNOSIS — R2231 Localized swelling, mass and lump, right upper limb: Secondary | ICD-10-CM

## 2024-02-21 DIAGNOSIS — R92333 Mammographic heterogeneous density, bilateral breasts: Secondary | ICD-10-CM | POA: Diagnosis not present

## 2024-02-21 DIAGNOSIS — N6489 Other specified disorders of breast: Secondary | ICD-10-CM | POA: Diagnosis not present

## 2024-04-08 DIAGNOSIS — K219 Gastro-esophageal reflux disease without esophagitis: Secondary | ICD-10-CM | POA: Diagnosis not present

## 2024-05-20 DIAGNOSIS — J302 Other seasonal allergic rhinitis: Secondary | ICD-10-CM | POA: Diagnosis not present

## 2024-05-20 DIAGNOSIS — Z0000 Encounter for general adult medical examination without abnormal findings: Secondary | ICD-10-CM | POA: Diagnosis not present

## 2024-05-20 DIAGNOSIS — K219 Gastro-esophageal reflux disease without esophagitis: Secondary | ICD-10-CM | POA: Diagnosis not present

## 2024-05-20 DIAGNOSIS — Z79899 Other long term (current) drug therapy: Secondary | ICD-10-CM | POA: Diagnosis not present

## 2024-05-20 DIAGNOSIS — E785 Hyperlipidemia, unspecified: Secondary | ICD-10-CM | POA: Diagnosis not present

## 2024-05-20 DIAGNOSIS — M810 Age-related osteoporosis without current pathological fracture: Secondary | ICD-10-CM | POA: Diagnosis not present

## 2024-05-20 DIAGNOSIS — Z789 Other specified health status: Secondary | ICD-10-CM | POA: Diagnosis not present

## 2024-05-20 DIAGNOSIS — G43909 Migraine, unspecified, not intractable, without status migrainosus: Secondary | ICD-10-CM | POA: Diagnosis not present

## 2024-05-24 ENCOUNTER — Other Ambulatory Visit (HOSPITAL_BASED_OUTPATIENT_CLINIC_OR_DEPARTMENT_OTHER): Payer: Self-pay | Admitting: Family Medicine

## 2024-05-24 DIAGNOSIS — M810 Age-related osteoporosis without current pathological fracture: Secondary | ICD-10-CM

## 2024-06-04 ENCOUNTER — Ambulatory Visit
Admission: EM | Admit: 2024-06-04 | Discharge: 2024-06-04 | Disposition: A | Discharge status: Home / Self Care | Attending: Family Medicine | Admitting: Family Medicine

## 2024-06-04 ENCOUNTER — Other Ambulatory Visit: Payer: Self-pay

## 2024-06-04 DIAGNOSIS — J189 Pneumonia, unspecified organism: Secondary | ICD-10-CM

## 2024-06-04 MED ORDER — AZITHROMYCIN 250 MG PO TABS: ORAL_TABLET | ORAL | 0 refills | Status: AC

## 2024-06-04 MED ORDER — ONDANSETRON 4 MG PO TBDP: 4.0000 mg | ORAL_TABLET | Freq: Three times a day (TID) | ORAL | 0 refills | Status: AC | PRN

## 2024-06-04 MED ORDER — AMOXICILLIN-POT CLAVULANATE 875-125 MG PO TABS: 1.0000 | ORAL_TABLET | Freq: Two times a day (BID) | ORAL | 0 refills | Status: AC

## 2024-06-04 MED ORDER — PROMETHAZINE-DM 6.25-15 MG/5ML PO SYRP: 5.0000 mL | ORAL_SOLUTION | Freq: Three times a day (TID) | ORAL | 0 refills | Status: AC | PRN

## 2024-06-04 NOTE — ED Triage Notes (Signed)
 Pt c/o of currently having a  non productive cough. Pt stated that she has had a productive cough with light yellow sputum and chills x 10 days . These symptoms have subsided and the cough persist. Pt stated that she has been using Theraflu. Last used today with some effectiveness.

## 2024-06-04 NOTE — ED Provider Notes (Signed)
 [facility]  Note:  This document was prepared using Conservation officer, historic buildings and may include unintentional dictation errors.  MRN: [id number] DOB: February 21, 1956  Subjective:   Judith Sherman is a 68 y.o. female presenting for 10 day history of a persistent productive cough. Has had associated shortness of breath, throat pain from coughing. Has been using Thera-flu temporarily. No fever, chest pain, wheezing, sinus congestion, ear pain, headaches, rashes. No asthma. No smoking of any kind including cigarettes, cigars, vaping, marijuana use.    No current facility-administered medications for this encounter.  Current Outpatient Medications:    acetaminophen  (TYLENOL ) 500 MG tablet, Take 2 tablets (1,000 mg total) by mouth every 8 (eight) hours as needed. Take 2 tabs every 8 hours., Disp: 30 tablet, Rfl: 0   azelastine  (ASTELIN ) 0.1 % nasal spray, Use 1-2 sprays twice a day as needed for nasal drainage., Disp: 30 mL, Rfl: 5   cetirizine  (ZYRTEC ) 10 MG tablet, Take 1 tablet (10 mg total) by mouth daily. One tab daily for allergies, Disp: 30 tablet, Rfl: 1   famotidine  (PEPCID ) 20 MG tablet, TAKE 1 TABLET BY MOUTH EVERYDAY AT BEDTIME, Disp: 30 tablet, Rfl: 0   fluticasone  (FLONASE ) 50 MCG/ACT nasal spray, Place 1 spray into both nostrils 2 (two) times daily. For nasal congestion., Disp: 16 g, Rfl: 5   montelukast  (SINGULAIR ) 10 MG tablet, Take 1 tablet (10 mg total) by mouth at bedtime. For allergies., Disp: 30 tablet, Rfl: 5   OVER THE COUNTER MEDICATION, , Disp: , Rfl:    pantoprazole  (PROTONIX ) 40 MG tablet, Take 1 tablet (40 mg total) by mouth 2 (two) times daily. For reflux - take 30 minutes before meals., Disp: 60 tablet, Rfl: 3   prochlorperazine  (COMPAZINE ) 10 MG tablet, Take 1 tablet (10 mg total) by mouth 2 (two) times daily as needed (for headache)., Disp: 10 tablet, Rfl: 0   rizatriptan  (MAXALT -MLT) 10 MG disintegrating tablet, Take 1 tablet (10 mg  total) by mouth as needed for migraine. May repeat in 2 hours if needed, Disp: 9 tablet, Rfl: 11   simvastatin  (ZOCOR ) 40 MG tablet, Take 1 tablet (40 mg total) by mouth at bedtime., Disp: 90 tablet, Rfl: 3   topiramate  (TOPAMAX ) 25 MG tablet, Take 1 tablet (25 mg total) by mouth at bedtime., Disp: 90 tablet, Rfl: 3   Allergies  Allergen Reactions   Aspirin     Past Medical History:  Diagnosis Date   Allergic rhinitis    Hyperlipidemia    Hypertension    LPRD (laryngopharyngeal reflux disease)    Evaluated by [facility]     Past Surgical History:  Procedure Laterality Date   BREAST LUMPECTOMY     benign   COLONOSCOPY N/A 07/12/2014   SLF:  1. small ulcer in teh terminal ileum 2. Small internal hemorrhoids.   TONSILLECTOMY      Family History  Problem Relation Age of Onset   Hypertension Mother    Headache Sister    Colon cancer Neg Hx    Asthma Neg Hx    Allergic rhinitis Neg Hx     Social History   Tobacco Use   Smoking status: Never   Smokeless tobacco: Never  Vaping Use   Vaping status: Never Used  Substance Use Topics   Alcohol use: No    Alcohol/week: 0.0 standard drinks of alcohol   Drug use: No    ROS   Objective:   Vitals: BP  131/77 (BP Location: Left Arm)   Pulse 74   Temp 98.7 F (37.1 C) (Oral)   Resp 18   Ht 5' 1 (1.549 m)   Wt 98 lb (44.5 kg)   SpO2 97%   BMI 18.52 kg/m   Physical Exam Constitutional:      General: She is not in acute distress.    Appearance: Normal appearance. She is well-developed. She is not ill-appearing, toxic-appearing or diaphoretic.  HENT:     Head: Normocephalic and atraumatic.     Nose: Nose normal.     Mouth/Throat:     Mouth: Mucous membranes are moist.     Pharynx: No pharyngeal swelling, oropharyngeal exudate, posterior oropharyngeal erythema or uvula swelling.     Tonsils: No tonsillar exudate or tonsillar abscesses. 0 on the right. 0 on the left.  Eyes:     General:  No scleral icterus.       Right eye: No discharge.        Left eye: No discharge.     Extraocular Movements: Extraocular movements intact.  Cardiovascular:     Rate and Rhythm: Normal rate and regular rhythm.     Heart sounds: Normal heart sounds. No murmur heard.    No friction rub. No gallop.  Pulmonary:     Effort: Pulmonary effort is normal. No respiratory distress.     Breath sounds: No stridor. Examination of the left-upper field reveals rales. Examination of the left-middle field reveals rales. Rales present. No wheezing or rhonchi (trace).  Chest:     Chest wall: No tenderness.  Skin:    General: Skin is warm and dry.  Neurological:     General: No focal deficit present.     Mental Status: She is alert and oriented to person, place, and time.  Psychiatric:        Mood and Affect: Mood normal.        Behavior: Behavior normal.     Assessment and Plan :   PDMP not reviewed this encounter.  1. Pneumonia of left upper lobe due to infectious organism    Recommended treating empirically with azithromycin , Augmentin given pulmonary exam.  Has difficulty with side effects with antibiotics and therefore offered Zofran . Counseled patient on potential for adverse effects with medications prescribed/recommended today, ER and return-to-clinic precautions discussed, patient verbalized understanding.    Christopher Savannah, [place] 06/04/24 [id number]

## 2024-06-04 NOTE — Discharge Instructions (Addendum)
 Your lung exam is consistent with having pneumonia of the left upper lung. Start Augmentin and azithromycin  to help with this infection. Use cough syrup as needed. Ondansetron  can help with nausea and vomiting if you get these side effects from the medications.   K?t qu? khm ph?i cho th?y b?n b? vim ph?i thy trn ph?i tri. Hy b?t ??u dng Augmentin v azithromycin  ?? h? tr? ?i?u tr? nhi?m trng ny. S? d?ng siro ho khi c?n thi?t. Ondansetron  c th? gip gi?m bu?n nn v nn n?u b?n g?p cc tc d?ng ph? ny do thu?c.

## 2024-06-24 ENCOUNTER — Encounter (INDEPENDENT_AMBULATORY_CARE_PROVIDER_SITE_OTHER): Payer: Self-pay | Admitting: *Deleted

## 2024-10-22 ENCOUNTER — Ambulatory Visit: Admitting: Family Medicine

## 2024-10-27 ENCOUNTER — Ambulatory Visit: Admitting: Family Medicine

## 2025-03-30 ENCOUNTER — Other Ambulatory Visit (HOSPITAL_BASED_OUTPATIENT_CLINIC_OR_DEPARTMENT_OTHER)
